# Patient Record
Sex: Male | Born: 1955 | Race: White | Hispanic: No | Marital: Married | Smoking: Former smoker
Health system: Southern US, Community
[De-identification: ages and names within clinical notes are randomized; demographics above are authoritative.]

## PROBLEM LIST (undated history)

## (undated) DIAGNOSIS — N200 Calculus of kidney: Secondary | ICD-10-CM

## (undated) HISTORY — DX: Calculus of kidney: N20.0

## (undated) HISTORY — PX: BICEPS TENDON REPAIR: SHX566

---

## 2003-05-29 HISTORY — PX: HERNIA REPAIR: SHX51

## 2005-10-19 ENCOUNTER — Ambulatory Visit: Payer: Self-pay | Admitting: General Surgery

## 2005-10-21 LAB — HM COLONOSCOPY

## 2008-02-27 DIAGNOSIS — E782 Mixed hyperlipidemia: Secondary | ICD-10-CM | POA: Insufficient documentation

## 2008-06-04 DIAGNOSIS — E669 Obesity, unspecified: Secondary | ICD-10-CM | POA: Insufficient documentation

## 2008-06-30 DIAGNOSIS — Z87891 Personal history of nicotine dependence: Secondary | ICD-10-CM | POA: Insufficient documentation

## 2011-04-27 LAB — HEPATIC FUNCTION PANEL
ALT: 23 U/L (ref 10–40)
AST: 22 U/L (ref 14–40)

## 2011-04-27 LAB — LIPID PANEL
Cholesterol: 185 mg/dL (ref 0–200)
HDL: 42 mg/dL (ref 35–70)
LDL Cholesterol: 87 mg/dL
TRIGLYCERIDES: 279 mg/dL — AB (ref 40–160)

## 2011-04-27 LAB — BASIC METABOLIC PANEL
BUN: 14 mg/dL (ref 4–21)
CREATININE: 1.4 mg/dL — AB (ref 0.6–1.3)
Glucose: 92 mg/dL
POTASSIUM: 4.3 mmol/L (ref 3.4–5.3)
Sodium: 139 mmol/L (ref 137–147)

## 2013-09-04 LAB — PSA: PSA: 1

## 2014-10-29 ENCOUNTER — Other Ambulatory Visit: Payer: Self-pay | Admitting: Family Medicine

## 2014-11-24 ENCOUNTER — Encounter (INDEPENDENT_AMBULATORY_CARE_PROVIDER_SITE_OTHER): Payer: Self-pay | Admitting: Family Medicine

## 2015-02-03 LAB — LIPID PANEL
CHOLESTEROL: 203 mg/dL — AB (ref 0–200)
HDL: 45 mg/dL (ref 35–70)
LDL CALC: 103 mg/dL
TRIGLYCERIDES: 272 mg/dL — AB (ref 40–160)

## 2015-02-03 LAB — BASIC METABOLIC PANEL
BUN: 13 mg/dL (ref 4–21)
CREATININE: 1.3 mg/dL (ref 0.6–1.3)
Glucose: 95 mg/dL

## 2015-02-03 LAB — HEPATIC FUNCTION PANEL
ALT: 20 U/L (ref 10–40)
AST: 23 U/L (ref 14–40)

## 2015-04-01 ENCOUNTER — Other Ambulatory Visit: Payer: Self-pay | Admitting: Family Medicine

## 2015-04-13 ENCOUNTER — Telehealth: Payer: Self-pay | Admitting: Family Medicine

## 2015-04-13 NOTE — Telephone Encounter (Signed)
Pt wife Manus Gunning called to get a refill for Rx pravastatin (PRAVACHOL) 40 MG tablet but there is a note stating pt needs an office visit.  Pt wife is requesting pt to see Dr Venia Minks instead of Dr Caryn Section from now on.  CB#[phone] or [phone]

## 2015-04-13 NOTE — Telephone Encounter (Signed)
Disregard this note.   Pt wife called back stating pt does not want to switch from Dr Opal Sidles

## 2015-05-18 ENCOUNTER — Other Ambulatory Visit: Payer: Self-pay | Admitting: Family Medicine

## 2015-05-18 NOTE — Telephone Encounter (Signed)
Please advise patient it has been over a year and a half since he has been seen and needs to scheduled o.v. Before we can refill his medications.

## 2015-05-20 NOTE — Telephone Encounter (Signed)
LMTCB 05/20/2015  Thanks,   -Mickel Baas

## 2015-05-31 NOTE — Telephone Encounter (Signed)
Pt returned call. I advised that pt would need an OV. I offered to schedule the appt and pt stated it was ridiculous to just come in and he would have to call back after looking at his schedule. Thanks TNP

## 2015-06-10 DIAGNOSIS — Z87442 Personal history of urinary calculi: Secondary | ICD-10-CM | POA: Insufficient documentation

## 2015-06-10 DIAGNOSIS — Z8249 Family history of ischemic heart disease and other diseases of the circulatory system: Secondary | ICD-10-CM | POA: Insufficient documentation

## 2015-06-14 ENCOUNTER — Encounter: Payer: Self-pay | Admitting: Family Medicine

## 2015-06-14 ENCOUNTER — Ambulatory Visit (INDEPENDENT_AMBULATORY_CARE_PROVIDER_SITE_OTHER): Payer: BLUE CROSS/BLUE SHIELD | Admitting: Family Medicine

## 2015-06-14 VITALS — BP 122/70 | HR 60 | Temp 98.0°F | Resp 16 | Ht 70.0 in | Wt 254.0 lb

## 2015-06-14 DIAGNOSIS — Z23 Encounter for immunization: Secondary | ICD-10-CM

## 2015-06-14 DIAGNOSIS — N529 Male erectile dysfunction, unspecified: Secondary | ICD-10-CM | POA: Diagnosis not present

## 2015-06-14 DIAGNOSIS — Z0000 Encounter for general adult medical examination without abnormal findings: Secondary | ICD-10-CM | POA: Diagnosis not present

## 2015-06-14 DIAGNOSIS — E669 Obesity, unspecified: Secondary | ICD-10-CM | POA: Diagnosis not present

## 2015-06-14 DIAGNOSIS — E782 Mixed hyperlipidemia: Secondary | ICD-10-CM

## 2015-06-14 DIAGNOSIS — Z8249 Family history of ischemic heart disease and other diseases of the circulatory system: Secondary | ICD-10-CM

## 2015-06-14 MED ORDER — PRAVASTATIN SODIUM 40 MG PO TABS: 40.0000 mg | ORAL_TABLET | Freq: Every day | ORAL | Status: DC

## 2015-06-14 MED ORDER — PREDNISONE 20 MG PO TABS: ORAL_TABLET | ORAL | Status: DC

## 2015-06-14 MED ORDER — NAPROXEN 500 MG PO TABS: 500.0000 mg | ORAL_TABLET | Freq: Two times a day (BID) | ORAL | Status: DC

## 2015-06-14 NOTE — Progress Notes (Signed)
Patient: Bruce White, Male    DOB: 04-28-1956, 60 y.o.   MRN: [id number] Visit Date: 06/21/2015  Today's Provider: Lelon Huh, MD   Chief Complaint  Patient presents with  . Annual Exam  . Hyperlipidemia    follow up   Subjective:    Annual physical exam Bruce White is a 60 y.o. male who presents today for health maintenance and complete physical. He feels well. He reports exercising a couple times a week. He reports he is sleeping well.  -----------------------------------------------------------------  Lipid/Cholesterol, Follow-up:   Last seen for this a year and a half ago. Management changes since that visit include none. . Last Lipid Panel:    Component Value Date/Time   CHOL 203* 02/03/2015   TRIG 272* 02/03/2015   HDL 45 02/03/2015   LDLCALC 103 02/03/2015    Risk factors for vascular disease include hypercholesterolemia  He reports good compliance with treatment. Patient reports he ran out of medication over a week ago.  He is not having side effects.  Current symptoms include none and have been stable. Weight trend: increasing  Prior visit with dietician: no Current diet: in general, a "healthy" diet   Current exercise: cardiovascular workout on exercise equipment  Wt Readings from Last 3 Encounters:  06/14/15 254 lb (115.214 kg)  09/04/13 228 lb (103.42 kg)  11/24/14 243 lb (110.224 kg)    -------------------------------------------------------------------   Review of Systems  Constitutional: Negative for fever, chills, appetite change and fatigue.  HENT: Negative for congestion, ear pain, hearing loss, nosebleeds and trouble swallowing.   Eyes: Negative for pain and visual disturbance.  Respiratory: Negative for cough, chest tightness and shortness of breath.   Cardiovascular: Negative for chest pain, palpitations and leg swelling.  Gastrointestinal: Negative for nausea, vomiting, abdominal pain, diarrhea, constipation and  blood in stool.  Endocrine: Negative for polydipsia, polyphagia and polyuria.  Genitourinary: Negative for dysuria and flank pain.  Musculoskeletal: Negative for myalgias, back pain, joint swelling, arthralgias and neck stiffness.  Skin: Negative for color change, rash and wound.  Neurological: Negative for dizziness, tremors, seizures, speech difficulty, weakness, light-headedness and headaches.  Psychiatric/Behavioral: Negative for behavioral problems, confusion, sleep disturbance, dysphoric mood and decreased concentration. The patient is not nervous/anxious.   All other systems reviewed and are negative.   Social History      He  reports that he quit smoking about 12 years ago. His smoking use included Cigarettes. He has a 60 pack-year smoking history. He does not have any smokeless tobacco history on file. He reports that he drinks alcohol. He reports that he does not use illicit drugs.       Social History   Social History  . Marital Status: Married    Spouse Name: N/A  . Number of Children: 2  . Years of Education: N/A   Occupational History  . [employer]    Social History Main Topics  . Smoking status: Former Smoker -- 2.00 packs/day for 30 years    Types: Cigarettes    Quit date: 05/29/2003  . Smokeless tobacco: None  . Alcohol Use: 0.0 oz/week    0 Standard drinks or equivalent per week     Comment: occasional use  . Drug Use: No  . Sexual Activity: Not Asked   Other Topics Concern  . None   Social History Narrative    Past Medical History  Diagnosis Date  . Kidney stones      Patient Active  Problem List   Diagnosis Date Noted  . Fam hx-ischem heart disease 06/10/2015  . Personal history of urinary calculi 06/10/2015  . History of tobacco use 06/30/2008  . Obesity 06/04/2008  . Hyperlipidemia, mixed 02/27/2008    Past Surgical History  Procedure Laterality Date  . Hernia repair  [id number]    Umbilical hernia repair. Surgeon was Dr. Jamal Collin  . Biceps  tendon repair      Family History        Family Status  Relation Status Death Age  . Mother Alive   . Father Alive   . Brother Alive   . Other Alive         His family history includes CAD in his father; Dementia in his father; Heart attack in his brother; Heart disease in his other; Hypertension in his father; Parkinson's disease in his father; Stroke in his father.    Allergies  Allergen Reactions  . Lovastatin     Previous Medications   ASPIRIN EC 81 MG TABLET    Take 81 mg by mouth daily.    Patient Care Team: Birdie Sons, MD as PCP - General (Family Medicine)     Objective:   Vitals: BP 122/70 mmHg  Pulse 60  Temp(Src) 98 F (36.7 C) (Oral)  Resp 16  Ht 5\' 10"  (1.778 m)  Wt 254 lb (115.214 kg)  BMI 36.45 kg/m2  SpO2 95%   Physical Exam   General Appearance:    Alert, cooperative, no distress, appears stated age  Head:    Normocephalic, without obvious abnormality, atraumatic  Eyes:    PERRL, conjunctiva/corneas clear, EOM's intact, fundi    benign, both eyes       Ears:    Normal TM's and external ear canals, both ears  Nose:   Nares normal, septum midline, mucosa normal, no drainage   or sinus tenderness  Throat:   Lips, mucosa, and tongue normal; teeth and gums normal  Neck:   Supple, symmetrical, trachea midline, no adenopathy;       thyroid:  No enlargement/tenderness/nodules; no carotid   bruit or JVD  Back:     Symmetric, no curvature, ROM normal, no CVA tenderness  Lungs:     Clear to auscultation bilaterally, respirations unlabored  Chest wall:    No tenderness or deformity  Heart:    Regular rate and rhythm, S1 and S2 normal, no murmur, rub   or gallop  Abdomen:     Soft, non-tender, bowel sounds active all four quadrants,    no masses, no organomegaly  Genitalia:    deferred  Rectal:    deferred  Extremities:   Extremities normal, atraumatic, no cyanosis or edema  Pulses:   2+ and symmetric all extremities  Skin:   Skin color,  texture, turgor normal, no rashes or lesions  Lymph nodes:   Cervical, supraclavicular, and axillary nodes normal  Neurologic:   CNII-XII intact. Normal strength, sensation and reflexes      throughout   Depression Screen PHQ 2/9 Scores 06/14/2015  PHQ - 2 Score 0  PHQ- 9 Score 0      Assessment & Plan:     Routine Health Maintenance and Physical Exam  Exercise Activities and Dietary recommendations Goals    None      Immunization History  Administered Date(s) Administered  . Influenza-Unspecified 03/12/2015  . Tdap 05/01/2005  . Zoster 06/14/2015    Health Maintenance  Topic Date Due  . HIV Screening  01/25/1970  . TETANUS/TDAP  05/02/2015  . COLONOSCOPY  10/22/2015  . INFLUENZA VACCINE  12/27/2015  . ZOSTAVAX  Completed  . Hepatitis C Screening  Completed      Discussed health benefits of physical activity, and encouraged him to engage in regular exercise appropriate for his age and condition.    --------------------------------------------------------------------  1. Annual physical exam  - PSA - Hepatitis C antibody - EKG 12-Lead  2. Hyperlipidemia, mixed Intolerant to lovastatin. Controlling with diet. Reviewed labs from work which show lipids fairly well controlled.   3. Obesity D&E  4. Fam hx-ischem heart disease Continue daily ECASA  5. Erectile dysfunction, unspecified erectile dysfunction type He declined prescription for ED medications at this time.  - TSH - Testosterone,Free and Total

## 2015-06-15 LAB — TESTOSTERONE,FREE AND TOTAL
TESTOSTERONE FREE: 7.7 pg/mL (ref 6.6–18.1)
Testosterone: 412 ng/dL (ref 348–1197)

## 2015-06-15 LAB — PSA: PROSTATE SPECIFIC AG, SERUM: 0.9 ng/mL (ref 0.0–4.0)

## 2015-06-15 LAB — TSH: TSH: 2.53 u[IU]/mL (ref 0.450–4.500)

## 2015-06-15 LAB — HEPATITIS C ANTIBODY: HEP C VIRUS AB: 0.1 {s_co_ratio} (ref 0.0–0.9)

## 2015-06-16 ENCOUNTER — Encounter: Payer: Self-pay | Admitting: Family Medicine

## 2015-08-08 ENCOUNTER — Ambulatory Visit (INDEPENDENT_AMBULATORY_CARE_PROVIDER_SITE_OTHER): Payer: BLUE CROSS/BLUE SHIELD | Admitting: Family Medicine

## 2015-08-08 ENCOUNTER — Encounter: Payer: Self-pay | Admitting: Family Medicine

## 2015-08-08 VITALS — BP 128/78 | HR 56 | Temp 98.6°F | Resp 14 | Wt 256.8 lb

## 2015-08-08 DIAGNOSIS — M545 Low back pain, unspecified: Secondary | ICD-10-CM

## 2015-08-08 NOTE — Progress Notes (Signed)
Patient ID: KAL WASSMUTH, male   DOB: 1955/12/02, 60 y.o.   MRN: [id number]   Patient: Bruce White Male    DOB: 1955-08-04   60 y.o.   MRN: [id number] Visit Date: 08/08/2015  Today's Provider: Vernie Murders, PA   Chief Complaint  Patient presents with  . Back Pain   Subjective:    Back Pain This is a new problem. The current episode started today. The problem occurs intermittently. The problem is unchanged. The pain is present in the lumbar spine. Quality: sharp. The pain is at a severity of 5/10. The symptoms are aggravated by sitting. Pertinent negatives include no leg pain, numbness, tingling or weakness. The treatment provided mild (2 Aleve and 2 packets of BioFreeze.) relief.    Patient Active Problem List   Diagnosis Date Noted  . Fam hx-ischem heart disease 06/10/2015  . Personal history of urinary calculi 06/10/2015  . History of tobacco use 06/30/2008  . Obesity 06/04/2008  . Hyperlipidemia, mixed 02/27/2008   Past Medical History  Diagnosis Date  . Kidney stones    Past Surgical History  Procedure Laterality Date  . Hernia repair  [id number]    Umbilical hernia repair. Surgeon was Dr. Jamal Collin  . Biceps tendon repair     Family History  Problem Relation Age of Onset  . Hypertension Father   . CAD Father   . Dementia Father   . Stroke Father   . Parkinson's disease Father   . Heart attack Brother   . Heart disease Other    Previous Medications   ASPIRIN EC 81 MG TABLET    Take 81 mg by mouth daily.   NAPROXEN (NAPROSYN) 500 MG TABLET    Take 1 tablet (500 mg total) by mouth 2 (two) times daily with a meal.   PRAVASTATIN (PRAVACHOL) 40 MG TABLET    Take 1 tablet (40 mg total) by mouth daily.   Allergies  Allergen Reactions  . Lovastatin     Review of Systems  Constitutional: Negative.   HENT: Negative.   Eyes: Negative.   Respiratory: Negative.   Cardiovascular: Negative.   Gastrointestinal: Negative.   Endocrine: Negative.   Genitourinary:  Negative.   Musculoskeletal: Positive for back pain.  Skin: Negative.   Allergic/Immunologic: Negative.   Neurological: Negative.  Negative for tingling, weakness and numbness.  Hematological: Negative.   Psychiatric/Behavioral: Negative.     Social History  Substance Use Topics  . Smoking status: Former Smoker -- 2.00 packs/day for 30 years    Types: Cigarettes    Quit date: 05/29/2003  . Smokeless tobacco: Not on file  . Alcohol Use: 0.0 oz/week    0 Standard drinks or equivalent per week     Comment: occasional use   Objective:   BP 128/78 mmHg  Pulse 56  Temp(Src) 98.6 F (37 C) (Oral)  Resp 14  Wt 256 lb 12.8 oz (116.484 kg)  Physical Exam  Constitutional: He is oriented to person, place, and time. He appears well-developed and well-nourished. No distress.  HENT:  Head: Normocephalic and atraumatic.  Right Ear: Hearing normal.  Left Ear: Hearing normal.  Nose: Nose normal.  Eyes: Conjunctivae and lids are normal. Right eye exhibits no discharge. Left eye exhibits no discharge. No scleral icterus.  Neck: Normal range of motion. Neck supple.  Cardiovascular: Normal rate.   Pulmonary/Chest: Effort normal and breath sounds normal. No respiratory distress.  Abdominal: Bowel sounds are normal.  Musculoskeletal: Normal range of motion.  Mild  soreness in lower lumbar musculature. Good ROM without spasms. No radiation of pain or weakness. DTR's symmetric. Normal gait.  Neurological: He is alert and oriented to person, place, and time. He has normal reflexes.  Skin: Skin is intact. No lesion and no rash noted.  Psychiatric: He has a normal mood and affect. His speech is normal and behavior is normal. Thought content normal.      Assessment & Plan:     1. Midline low back pain without sciatica Onset this morning (around 6AM) when he bent over the bed to pick up a sweatshirt. No significant history of back pains or injuries. Rides in a car for long distances frequently and  some heavy lifting occasionally at work. Continue Aleve BID for a week, apply moist heat or BioFreeze rub. May add Percogesic for muscle tightness. Recheck prn. Given rehab exercises and information sheets.

## 2015-08-08 NOTE — Patient Instructions (Signed)
Back Exercises The following exercises strengthen the muscles that help to support the back. They also help to keep the lower back flexible. Doing these exercises can help to prevent back pain or lessen existing pain. If you have back pain or discomfort, try doing these exercises 2-3 times each day or as told by your health care provider. When the pain goes away, do them once each day, but increase the number of times that you repeat the steps for each exercise (do more repetitions). If you do not have back pain or discomfort, do these exercises once each day or as told by your health care provider. EXERCISES Single Knee to Chest Repeat these steps 3-5 times for each leg: 1. Lie on your back on a firm bed or the floor with your legs extended. 2. Bring one knee to your chest. Your other leg should stay extended and in contact with the floor. 3. Hold your knee in place by grabbing your knee or thigh. 4. Pull on your knee until you feel a gentle stretch in your lower back. 5. Hold the stretch for 10-30 seconds. 6. Slowly release and straighten your leg. Pelvic Tilt Repeat these steps 5-10 times: 1. Lie on your back on a firm bed or the floor with your legs extended. 2. Bend your knees so they are pointing toward the ceiling and your feet are flat on the floor. 3. Tighten your lower abdominal muscles to press your lower back against the floor. This motion will tilt your pelvis so your tailbone points up toward the ceiling instead of pointing to your feet or the floor. 4. With gentle tension and even breathing, hold this position for 5-10 seconds. Cat-Cow Repeat these steps until your lower back becomes more flexible: 1. Get into a hands-and-knees position on a firm surface. Keep your hands under your shoulders, and keep your knees under your hips. You may place padding under your knees for comfort. 2. Let your head hang down, and point your tailbone toward the floor so your lower back becomes  rounded like the back of a cat. 3. Hold this position for 5 seconds. 4. Slowly lift your head and point your tailbone up toward the ceiling so your back forms a sagging arch like the back of a cow. 5. Hold this position for 5 seconds. Press-Ups Repeat these steps 5-10 times: 1. Lie on your abdomen (face-down) on the floor. 2. Place your palms near your head, about shoulder-width apart. 3. While you keep your back as relaxed as possible and keep your hips on the floor, slowly straighten your arms to raise the top half of your body and lift your shoulders. Do not use your back muscles to raise your upper torso. You may adjust the placement of your hands to make yourself more comfortable. 4. Hold this position for 5 seconds while you keep your back relaxed. 5. Slowly return to lying flat on the floor. Bridges Repeat these steps 10 times: 1. Lie on your back on a firm surface. 2. Bend your knees so they are pointing toward the ceiling and your feet are flat on the floor. 3. Tighten your buttocks muscles and lift your buttocks off of the floor until your waist is at almost the same height as your knees. You should feel the muscles working in your buttocks and the back of your thighs. If you do not feel these muscles, slide your feet 1-2 inches farther away from your buttocks. 4. Hold this position for 3-5   seconds. 5. Slowly lower your hips to the starting position, and allow your buttocks muscles to relax completely. If this exercise is too easy, try doing it with your arms crossed over your chest. Abdominal Crunches Repeat these steps 5-10 times: 1. Lie on your back on a firm bed or the floor with your legs extended. 2. Bend your knees so they are pointing toward the ceiling and your feet are flat on the floor. 3. Cross your arms over your chest. 4. Tip your chin slightly toward your chest without bending your neck. 5. Tighten your abdominal muscles and slowly raise your trunk (torso) high  enough to lift your shoulder blades a tiny bit off of the floor. Avoid raising your torso higher than that, because it can put too much stress on your low back and it does not help to strengthen your abdominal muscles. 6. Slowly return to your starting position. Back Lifts Repeat these steps 5-10 times: 1. Lie on your abdomen (face-down) with your arms at your sides, and rest your forehead on the floor. 2. Tighten the muscles in your legs and your buttocks. 3. Slowly lift your chest off of the floor while you keep your hips pressed to the floor. Keep the back of your head in line with the curve in your back. Your eyes should be looking at the floor. 4. Hold this position for 3-5 seconds. 5. Slowly return to your starting position. SEEK MEDICAL CARE IF:  Your back pain or discomfort gets much worse when you do an exercise.  Your back pain or discomfort does not lessen within 2 hours after you exercise. If you have any of these problems, stop doing these exercises right away. Do not do them again unless your health care provider says that you can. SEEK IMMEDIATE MEDICAL CARE IF:  You develop sudden, severe back pain. If this happens, stop doing the exercises right away. Do not do them again unless your health care provider says that you can.   This information is not intended to replace advice given to you by your health care provider. Make sure you discuss any questions you have with your health care provider.   Document Released: 06/21/2004 Document Revised: 02/02/2015 Document Reviewed: 07/08/2014 Elsevier Interactive Patient Education 2016 [employer] Back Pain, Adult Back pain is very common in adults.The cause of back pain is rarely dangerous and the pain often gets better over time.The cause of your back pain may not be known. Some common causes of back pain include: 7. Strain of the muscles or ligaments supporting the spine. 8. Wear and tear (degeneration) of the spinal  disks. 9. Arthritis. 10. Direct injury to the back. For many people, back pain may return. Since back pain is rarely dangerous, most people can learn to manage this condition on their own. HOME CARE INSTRUCTIONS Watch your back pain for any changes. The following actions may help to lessen any discomfort you are feeling: 5. Remain active. It is stressful on your back to sit or stand in one place for long periods of time. Do not sit, drive, or stand in one place for more than 30 minutes at a time. Take short walks on even surfaces as soon as you are able.Try to increase the length of time you walk each day. 6. Exercise regularly as directed by your health care provider. Exercise helps your back heal faster. It also helps avoid future injury by keeping your muscles strong and flexible. 7. Do not stay in bed.Resting  more than 1-2 days can delay your recovery. 8. Pay attention to your body when you bend and lift. The most comfortable positions are those that put less stress on your recovering back. Always use proper lifting techniques, including: 1. Bending your knees. 2. Keeping the load close to your body. 3. Avoiding twisting. 9. Find a comfortable position to sleep. Use a firm mattress and lie on your side with your knees slightly bent. If you lie on your back, put a pillow under your knees. 10. Avoid feeling anxious or stressed.Stress increases muscle tension and can worsen back pain.It is important to recognize when you are anxious or stressed and learn ways to manage it, such as with exercise. 11. Take medicines only as directed by your health care provider. Over-the-counter medicines to reduce pain and inflammation are often the most helpful.Your health care provider may prescribe muscle relaxant drugs.These medicines help dull your pain so you can more quickly return to your normal activities and healthy exercise. 12. Apply ice to the injured area: 1. Put ice in a plastic bag. 2. Place a  towel between your skin and the bag. 3. Leave the ice on for 20 minutes, 2-3 times a day for the first 2-3 days. After that, ice and heat may be alternated to reduce pain and spasms. 13. Maintain a healthy weight. Excess weight puts extra stress on your back and makes it difficult to maintain good posture. SEEK MEDICAL CARE IF: 6. You have pain that is not relieved with rest or medicine. 7. You have increasing pain going down into the legs or buttocks. 8. You have pain that does not improve in one week. 9. You have night pain. 10. You lose weight. 11. You have a fever or chills. SEEK IMMEDIATE MEDICAL CARE IF:  6. You develop new bowel or bladder control problems. 7. You have unusual weakness or numbness in your arms or legs. 8. You develop nausea or vomiting. 9. You develop abdominal pain. 10. You feel faint.   This information is not intended to replace advice given to you by your health care provider. Make sure you discuss any questions you have with your health care provider.   Document Released: 05/14/2005 Document Revised: 06/04/2014 Document Reviewed: 09/15/2013 Elsevier Interactive Patient Education [employer].

## 2015-11-07 ENCOUNTER — Telehealth: Payer: Self-pay | Admitting: Family Medicine

## 2015-11-07 DIAGNOSIS — M25569 Pain in unspecified knee: Secondary | ICD-10-CM | POA: Insufficient documentation

## 2015-11-07 NOTE — Telephone Encounter (Signed)
Pt's wife called and stated the patient would like a referral to an orthopedic doctor for knee problems.  I told the pt's wife he would probably need an office visit due to the referral and typically we have to send over notes to the referring provider and wife stated the pt didn't want to come see Dr. Caryn Section for a knee problem because he is not a knee doctor.  I told her I would send a message back.  Please advise.

## 2015-11-07 NOTE — Telephone Encounter (Signed)
Please refer orthopedics for knee pain

## 2015-11-07 NOTE — Telephone Encounter (Signed)
Please advise 

## 2015-11-11 DIAGNOSIS — M2241 Chondromalacia patellae, right knee: Secondary | ICD-10-CM | POA: Diagnosis not present

## 2015-11-11 DIAGNOSIS — M2242 Chondromalacia patellae, left knee: Secondary | ICD-10-CM | POA: Diagnosis not present

## 2015-11-11 DIAGNOSIS — M2240 Chondromalacia patellae, unspecified knee: Secondary | ICD-10-CM | POA: Insufficient documentation

## 2016-01-03 NOTE — Telephone Encounter (Signed)
error 

## 2016-03-11 ENCOUNTER — Encounter: Payer: Self-pay | Admitting: Emergency Medicine

## 2016-03-11 ENCOUNTER — Emergency Department: Payer: BLUE CROSS/BLUE SHIELD

## 2016-03-11 ENCOUNTER — Emergency Department
Admission: EM | Admit: 2016-03-11 | Discharge: 2016-03-11 | Disposition: A | Discharge status: Home / Self Care | Payer: BLUE CROSS/BLUE SHIELD | Attending: Emergency Medicine | Admitting: Emergency Medicine

## 2016-03-11 DIAGNOSIS — M7122 Synovial cyst of popliteal space [Baker], left knee: Secondary | ICD-10-CM | POA: Diagnosis not present

## 2016-03-11 DIAGNOSIS — M25462 Effusion, left knee: Secondary | ICD-10-CM | POA: Diagnosis not present

## 2016-03-11 DIAGNOSIS — Z87891 Personal history of nicotine dependence: Secondary | ICD-10-CM | POA: Diagnosis not present

## 2016-03-11 DIAGNOSIS — M7989 Other specified soft tissue disorders: Secondary | ICD-10-CM

## 2016-03-11 DIAGNOSIS — M79662 Pain in left lower leg: Secondary | ICD-10-CM

## 2016-03-11 DIAGNOSIS — Z7982 Long term (current) use of aspirin: Secondary | ICD-10-CM | POA: Diagnosis not present

## 2016-03-11 MED ORDER — OXYCODONE-ACETAMINOPHEN 5-325 MG PO TABS: 1.0000 | ORAL_TABLET | ORAL | 0 refills | Status: DC | PRN

## 2016-03-11 MED ORDER — NAPROXEN 500 MG PO TABS: 500.0000 mg | ORAL_TABLET | Freq: Two times a day (BID) | ORAL | 0 refills | Status: AC

## 2016-03-11 NOTE — ED Triage Notes (Signed)
Pt has pain and swelling to left calf. Does travel to the beach and back some. Swelling also noted to left knee. Calf tight on exam. No redness. No fevers.

## 2016-03-11 NOTE — ED Provider Notes (Signed)
[facility] Emergency Department Provider Note  ____________________________________________  Time seen: Approximately 5:12 PM  I have reviewed the triage vital signs and the nursing notes.   HISTORY  Chief Complaint Other (calf pain)   HPI Bruce White is a 60 y.o. male with h/o b/l knee arthritis who presents for evaluation of swelling and pain of his left calf. Patient reports 3 days ago he noticed a knot behind his left knee. Over the course of the last 3 days he has noticed progressively worsening swelling and pain on that leg. The pain is only present when he walks or bears weight on it. He has had no pain at rest. No chest pain or shortness of breath. Patient has never had a DVT or PE, no family history of blood clots, no recent travel or immobilization, no hormones. 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 Currently he endorses no pain while sitting down in bed. He reports that the pain becomes moderate to severe when he weigh bears.   Past Medical History:  Diagnosis Date  . Kidney stones     Patient Active Problem List   Diagnosis Date Noted  . Knee pain 11/07/2015  . Fam hx-ischem heart disease 06/10/2015  . Personal history of urinary calculi 06/10/2015  . History of tobacco use 06/30/2008  . Obesity 06/04/2008  . Hyperlipidemia, mixed 02/27/2008    Past Surgical History:  Procedure Laterality Date  . BICEPS TENDON REPAIR    . HERNIA REPAIR  [id number]   Umbilical hernia repair. Surgeon was Dr. Jamal Collin    Prior to Admission medications   Medication Sig Start Date End Date Taking? Authorizing Provider  aspirin EC 81 MG tablet Take 81 mg by mouth daily.    Historical Provider, MD  naproxen (NAPROSYN) 500 MG tablet Take 1 tablet (500 mg total) by mouth 2 (two) times daily with a meal. 03/11/16 03/11/17  Rudene Re, MD  oxyCODONE-acetaminophen (ROXICET)  5-325 MG tablet Take 1 tablet by mouth every 4 (four) hours as needed for severe pain. 03/11/16   Rudene Re, MD  pravastatin (PRAVACHOL) 40 MG tablet Take 1 tablet (40 mg total) by mouth daily. 06/14/15   Birdie Sons, MD    Allergies Lovastatin  Family History  Problem Relation Age of Onset  . Hypertension Father   . CAD Father   . Dementia Father   . Stroke Father   . Parkinson's disease Father   . Heart attack Brother   . Heart disease Other     Social History Social History  Substance Use Topics  . Smoking status: Former Smoker    Packs/day: 2.00    Years: 30.00    Types: Cigarettes    Quit date: 05/29/2003  . Smokeless tobacco: Not on file  . Alcohol use 0.0 oz/week     Comment: occasional use    Review of Systems  Constitutional: Negative for fever. Eyes: Negative for visual changes. ENT: Negative for sore throat. Cardiovascular: Negative for chest pain. Respiratory: Negative for shortness of breath. Gastrointestinal: Negative for abdominal pain, vomiting or diarrhea. Genitourinary: Negative for dysuria. Musculoskeletal: Negative for back pain. + LLE pain and swelling Skin: Negative for rash. Neurological: Negative for headaches, weakness or numbness.  ____________________________________________   PHYSICAL EXAM:  VITAL SIGNS: ED Triage Vitals [03/11/16 1522]  Enc Vitals Group     BP (!) 141/105  Pulse Rate 92     Resp 18     Temp 97.7 F (36.5 C)     Temp Source Oral     SpO2 98 %     Weight 235 lb (106.6 kg)     Height 5\' 10"  (1.778 m)     Head Circumference      Peak Flow      Pain Score 5     Pain Loc      Pain Edu?      Excl. in [place]?     Constitutional: Alert and oriented. Well appearing and in no apparent distress. HEENT:      Head: Normocephalic and atraumatic.         Eyes: Conjunctivae are normal. Sclera is non-icteric. EOMI. PERRL      Mouth/Throat: Mucous membranes are moist.       Neck: Supple with no signs of  meningismus. Cardiovascular: Regular rate and rhythm. No murmurs, gallops, or rubs. 2+ symmetrical distal pulses are present in all extremities. No JVD. Respiratory: Normal respiratory effort. Lungs are clear to auscultation bilaterally. No wheezes, crackles, or rhonchi.  Gastrointestinal: Soft, non tender, and non distended with positive bowel sounds. No rebound or guarding. Musculoskeletal: Asymmetric swelling of the left calf, no pain with passive flexion and extension of the big toe, strong distal pulses, no bruising, no bony deformities.  Neurologic: Normal speech and language. Face is symmetric. Moving all extremities. No gross focal neurologic deficits are appreciated. Skin: Skin is warm, dry and intact. No rash noted. Psychiatric: Mood and affect are normal. Speech and behavior are normal.  ____________________________________________   LABS (all labs ordered are listed, but only abnormal results are displayed)  Labs Reviewed - No data to display ____________________________________________  EKG  none ____________________________________________  RADIOLOGY  XR knee:  Small joint effusion without focal acute osseous abnormality. This may be related to inflammatory arthritis or soft tissue injury  US doppler: Sonographic survey of the left lower extremity negative for DVT.  Fluid collection left popliteal fossa, likely Baker's cyst. ____________________________________________   PROCEDURES  Procedure(s) performed: None Procedures Critical Care performed:  None ____________________________________________   INITIAL IMPRESSION / ASSESSMENT AND PLAN / ED COURSE  60 y.o. male with h/o b/l knee arthritis who presents for evaluation of swelling and pain of his left calf x3 days. Patient has significant asymmetric swelling of the left lower extremity starting at the knee going down the calf. Patient has strong distal pulses, no tenderness with passive movement of his big  toe, no pain at rest, no paresthesias. Doppler ultrasound showing no evidence of DVT and consistent with a Baker's cyst in the left popliteal fossa. Patient no signs or symptoms of compartment syndrome. Discussed with Dr. Roland Rack recommended NSAIDs, elevation, compression stocking, and follow-up in the clinic for possible steroid injections if symptoms are not getting better. Discussed signs or symptoms of compartment syndrome with patient and recommended that he return if these develop. Patient is in agreement. We'll discharge home.  Clinical Course    Pertinent labs & imaging results that were available during my care of the patient were reviewed by me and considered in my medical decision making (see chart for details).    ____________________________________________   FINAL CLINICAL IMPRESSION(S) / ED DIAGNOSES  Final diagnoses:  Baker cyst, left  Pain and swelling of left lower leg      NEW MEDICATIONS STARTED DURING THIS VISIT:  New Prescriptions   NAPROXEN (NAPROSYN) 500 MG TABLET  Take 1 tablet (500 mg total) by mouth 2 (two) times daily with a meal.   OXYCODONE-ACETAMINOPHEN (ROXICET) 5-325 MG TABLET    Take 1 tablet by mouth every 4 (four) hours as needed for severe pain.     Note:  This document was prepared using Dragon voice recognition software and may include unintentional dictation errors.    Rudene Re, MD 03/11/16 1728

## 2016-03-11 NOTE — Discharge Instructions (Signed)
Follow-up with your primary care doctor in a week. Follow-up with orthopedics if you're still having symptoms after a week. As we discussed there is a very serious condition called compartment syndrome that can develop due to the swelling. If the symptoms below develop please return to the emergency room immediately.  The most common symptom of compartment syndrome is pain. The pain may:  Get worse when moving or stretching the affected body part. Be more severe than it should be for an injury. Come along with a feeling of tingling or burning. Become worse when the area is pushed or squeezed. Be unaffected by pain medicine. Other symptoms include:  A feeling of tightness or fullness in the affected area.   A loss of feeling. Weakness in the area. Loss of movement. Skin becoming pale, tight, and shiny over the painful area.     For the pain take medications as prescribed, elevate the leg, compression stockings and stay off your feet as much as you can

## 2016-03-11 NOTE — ED Notes (Signed)
Pt states knee pain since the first of the year - saw dr Sabra Heck in June and was told to loose weight and placed on meloxicam. States the pain comes when he walks a lot, which he has been doing the last 3 days. Declined to sit in the bed, wanted to sit in the wheelchair. Had his [place] and xr.

## 2016-03-21 ENCOUNTER — Telehealth: Payer: Self-pay | Admitting: Family Medicine

## 2016-03-21 NOTE — Telephone Encounter (Signed)
error 

## 2016-03-22 DIAGNOSIS — M2241 Chondromalacia patellae, right knee: Secondary | ICD-10-CM | POA: Diagnosis not present

## 2016-03-22 DIAGNOSIS — M2242 Chondromalacia patellae, left knee: Secondary | ICD-10-CM | POA: Diagnosis not present

## 2016-07-18 ENCOUNTER — Other Ambulatory Visit: Payer: Self-pay | Admitting: Family Medicine

## 2016-07-27 ENCOUNTER — Other Ambulatory Visit: Payer: Self-pay | Admitting: Family Medicine

## 2016-07-27 MED ORDER — PRAVASTATIN SODIUM 40 MG PO TABS: 40.0000 mg | ORAL_TABLET | Freq: Every day | ORAL | 0 refills | Status: DC

## 2016-07-27 NOTE — Telephone Encounter (Signed)
Pt needs refill on his   pravastatin (PRAVACHOL) 40 MG tablet   07/18/16 -- Birdie Sons, MD    TAKE 1 TABLET (40 MG TOTAL) BY MOUTH DAILY.     Thanks [employer]

## 2016-07-27 NOTE — Telephone Encounter (Signed)
Please advise. Thanks.  

## 2016-10-16 ENCOUNTER — Other Ambulatory Visit: Payer: Self-pay | Admitting: Family Medicine

## 2016-12-21 ENCOUNTER — Other Ambulatory Visit: Payer: Self-pay | Admitting: Family Medicine

## 2017-01-15 DIAGNOSIS — H43313 Vitreous membranes and strands, bilateral: Secondary | ICD-10-CM | POA: Diagnosis not present

## 2017-01-15 DIAGNOSIS — H2513 Age-related nuclear cataract, bilateral: Secondary | ICD-10-CM | POA: Diagnosis not present

## 2017-01-16 ENCOUNTER — Other Ambulatory Visit: Payer: Self-pay | Admitting: Family Medicine

## 2017-02-20 ENCOUNTER — Other Ambulatory Visit: Payer: Self-pay | Admitting: Family Medicine

## 2017-02-20 NOTE — Telephone Encounter (Signed)
He can schedule a CPE. There is no copay required for preventative care visits.

## 2017-02-20 NOTE — Telephone Encounter (Signed)
Patient's wife advised. She states she will advise patient and call back to schedule appointment.

## 2017-02-20 NOTE — Telephone Encounter (Signed)
Patient's wife advised that patient needs a follow up appointment before refills can be authorized. She states they changed insurance companies and he would have to pay $170 co pay to be seen. She is requesting a lab slip to have labs done instead of a OV. Please advise.

## 2017-02-20 NOTE — Telephone Encounter (Signed)
Patient has not been seen in nearly a year and a half, needs o.v. Scheduled before refill can be approved.

## 2017-02-25 ENCOUNTER — Other Ambulatory Visit: Payer: Self-pay | Admitting: Family Medicine

## 2017-03-14 ENCOUNTER — Other Ambulatory Visit: Payer: Self-pay | Admitting: Family Medicine

## 2017-03-18 ENCOUNTER — Other Ambulatory Visit: Payer: Self-pay | Admitting: Family Medicine

## 2017-04-17 ENCOUNTER — Other Ambulatory Visit: Payer: Self-pay | Admitting: Family Medicine

## 2017-04-30 ENCOUNTER — Encounter: Payer: Self-pay | Admitting: *Deleted

## 2017-05-07 ENCOUNTER — Other Ambulatory Visit: Payer: Self-pay | Admitting: Family Medicine

## 2017-05-09 ENCOUNTER — Telehealth: Payer: Self-pay | Admitting: Family Medicine

## 2017-05-09 NOTE — Telephone Encounter (Signed)
no

## 2017-05-09 NOTE — Telephone Encounter (Signed)
Please advise refill? 

## 2017-05-09 NOTE — Telephone Encounter (Signed)
Pt's wife Manus Gunning contacted office for refill request on the following medications:  pravastatin (PRAVACHOL) 40 MG tablet  [facility] [address] [address] advised wife that pt would need OV for refill. Manus Gunning stated that their insurance has a high deductible and it would cost them 170$ out of pocket for an OV. I advised pt hasn't been seen in over a year and he would have to come in for OV. Manus Gunning stated that she would talk with pt but requested for the request to be sent to Dr. Caryn Section to see if he would refill the medication without OV. Please advise. Thanks TNP

## 2017-05-10 MED ORDER — PRAVASTATIN SODIUM 40 MG PO TABS: 40.0000 mg | ORAL_TABLET | Freq: Every day | ORAL | 0 refills | Status: DC

## 2017-05-10 NOTE — Telephone Encounter (Signed)
He needs office visit. This is a requirement by the medical board. Have sent in 30 day prescription. He can schedule a CPE here in January, there is no out of pocket expense for CPE.

## 2017-05-10 NOTE — Telephone Encounter (Signed)
Advised wife as below. She will get the patient to call and schedule a CPE in January.

## 2017-05-10 NOTE — Telephone Encounter (Signed)
Advised patient's wife as below. She reports that the patient had a physical done thru his employer in 01/2017. She reports that everything was "normal" and he also had labs done (this is scanned into patient's chart). She is wanting to know if the labs he had done would be good enough for you to go by so he can have his cholesterol medication refilled? She is not willing to pay the $170 out of pocket. Please advise.

## 2017-05-15 DIAGNOSIS — J189 Pneumonia, unspecified organism: Secondary | ICD-10-CM | POA: Diagnosis not present

## 2017-05-15 DIAGNOSIS — R05 Cough: Secondary | ICD-10-CM | POA: Diagnosis not present

## 2017-05-15 DIAGNOSIS — M791 Myalgia, unspecified site: Secondary | ICD-10-CM | POA: Diagnosis not present

## 2017-05-17 IMAGING — US US EXTREM LOW VENOUS*L*
1 series · 13 of 24 positions shown · non-contrast
Comparison: None.

CLINICAL DATA: 60-year-old male with a history of left knee calf
swell



[Series 1: us extrem low venous*left* · 0.07mm/px · 13 of 48 slices shown]
[im 1/48]
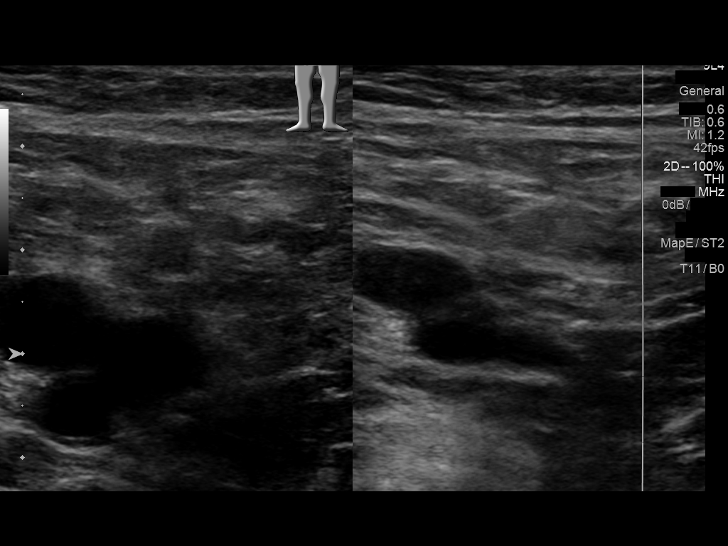
[im 5/48]
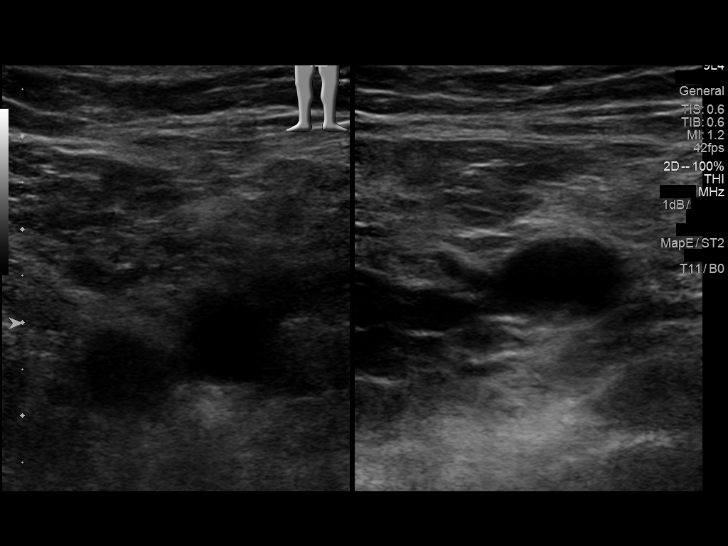
[im 9/48]
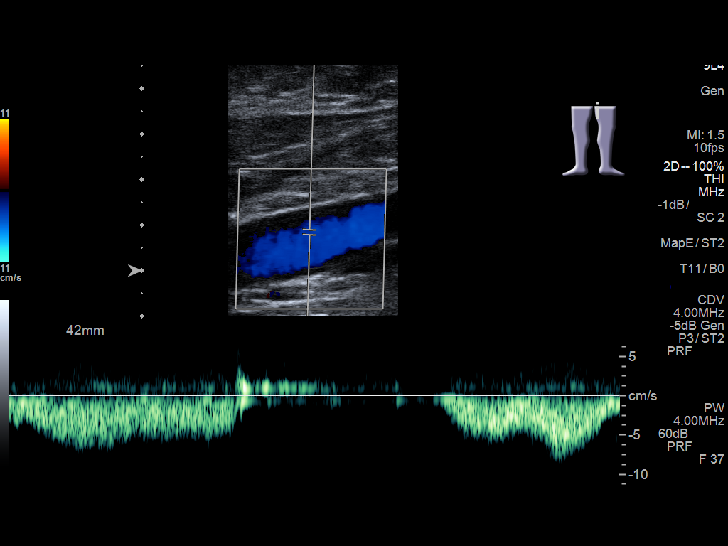
[im 13/48]
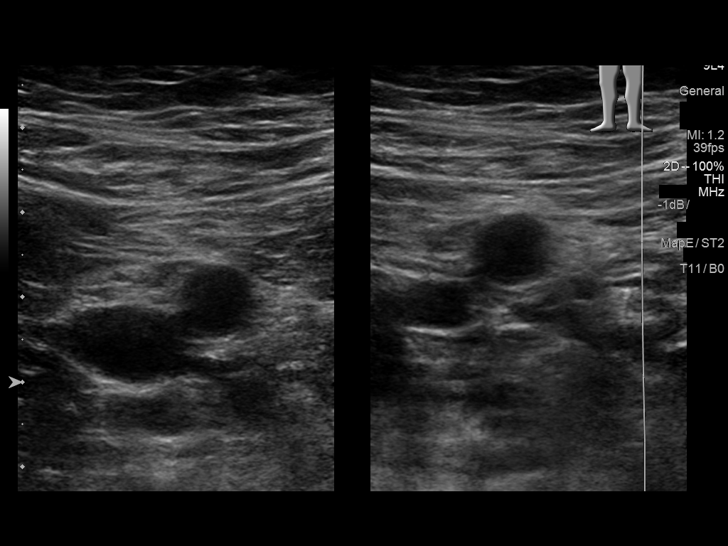
[im 17/48]
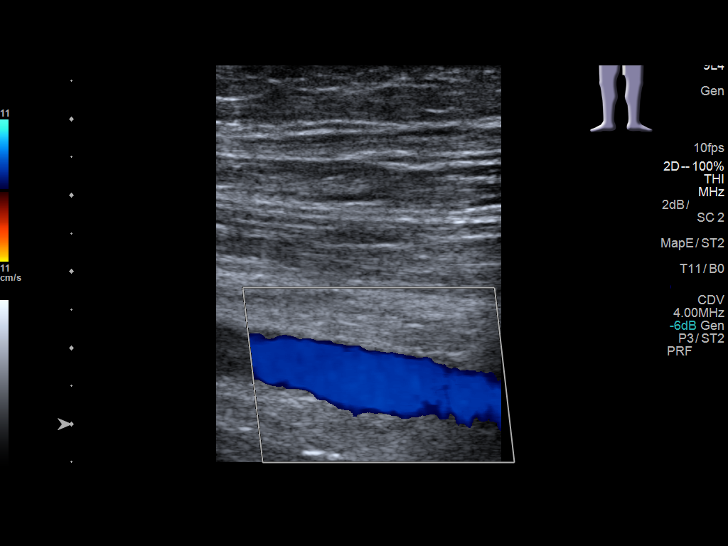
[im 21/48]
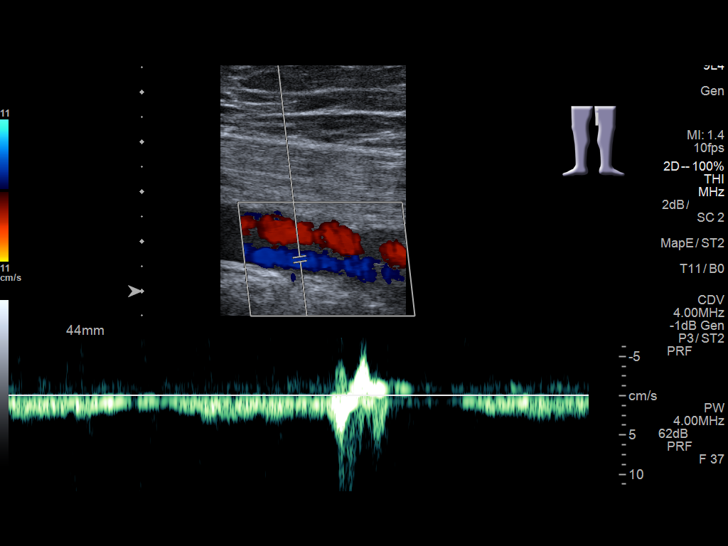
[im 25/48]
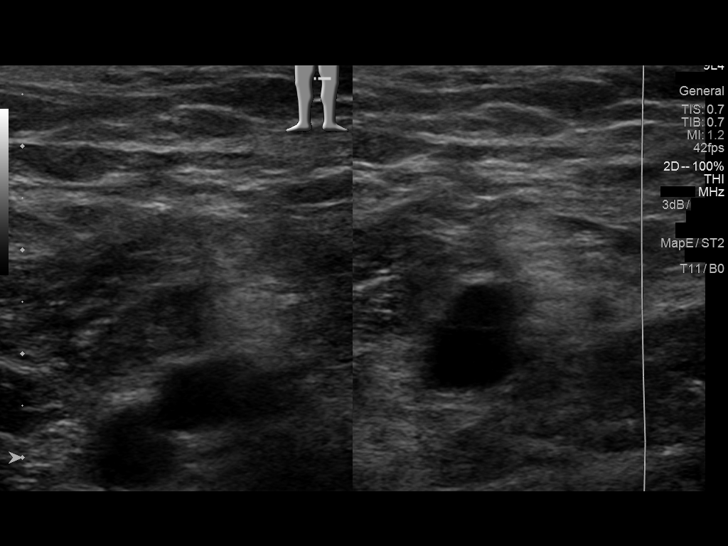
[im 27/48]
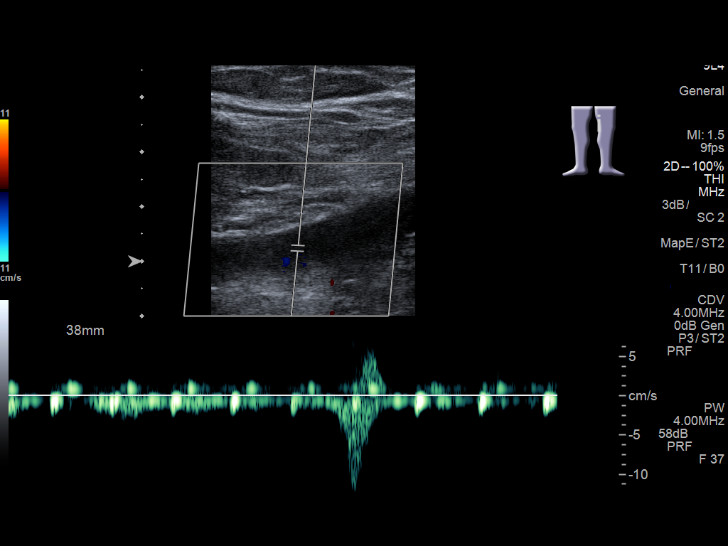
[im 31/48]
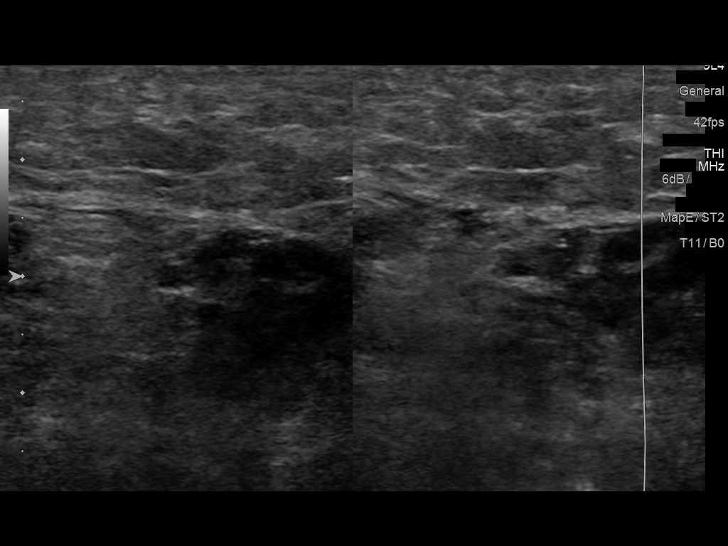
[im 35/48]
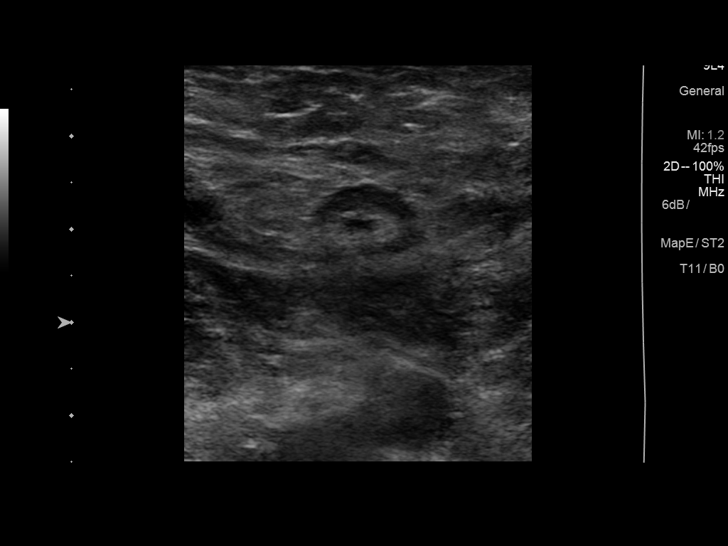
[im 39/48]
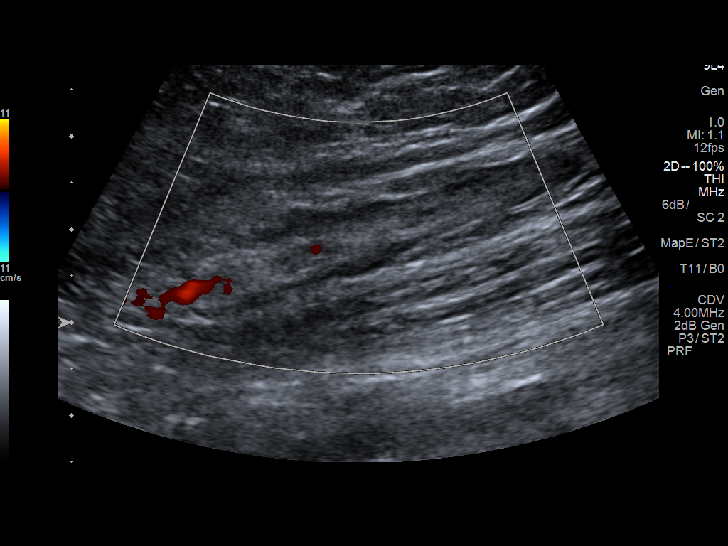
[im 43/48]
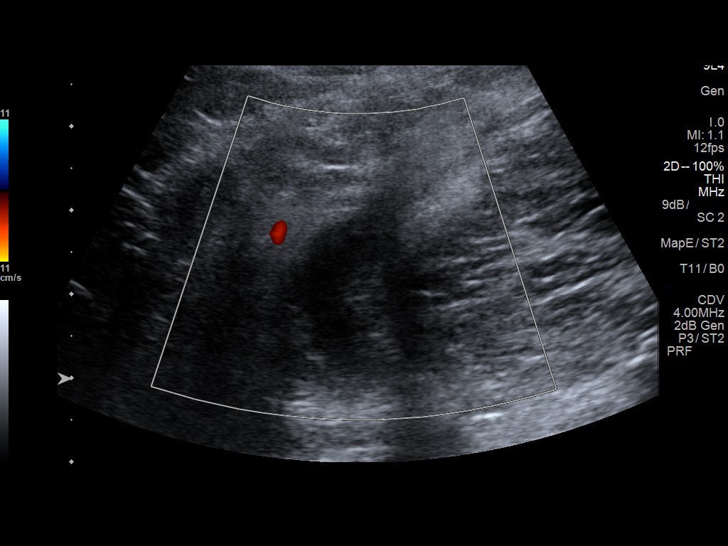
[im 48/48]
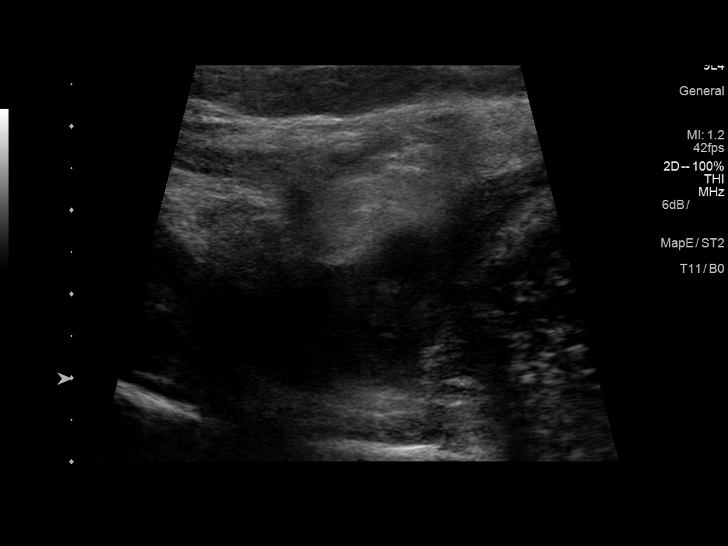

[13 of 24 positions shown; findings below may reference images not displayed]

FINDINGS: Contralateral Common Femoral Vein: Respiratory phasicity is normal
and symmetric with the symptomatic side. No evidence of thrombus.
Normal compressibility.

Common Femoral Vein: No evidence of thrombus. Normal
compressibility, respiratory phasicity and response to augmentation.

Saphenofemoral Junction: No evidence of thrombus. Normal
compressibility and flow on color Doppler imaging.

Profunda Femoral Vein: No evidence of thrombus. Normal
compressibility and flow on color Doppler imaging.

Femoral Vein: No evidence of thrombus. Normal compressibility,
respiratory phasicity and response to augmentation.

Popliteal Vein: No evidence of thrombus. Normal compressibility,
respiratory phasicity and response to augmentation.

Calf Veins: No evidence of thrombus. Normal compressibility and flow
on color Doppler imaging.

Superficial Great Saphenous Vein: No evidence of thrombus. Normal
compressibility and flow on color Doppler imaging.

Other Findings: Complex hypoechoic fluid collection in the left
popliteal fossa measures as great is 2.8 cm. Likely reactive
inguinal lymph nodes.
IMPRESSION: Sonographic survey of the left lower extremity negative for DVT.

Fluid collection left popliteal fossa, likely Baker's cyst.

## 2017-06-10 ENCOUNTER — Other Ambulatory Visit: Payer: Self-pay | Admitting: Family Medicine

## 2017-07-09 ENCOUNTER — Encounter: Payer: Self-pay | Admitting: Family Medicine

## 2017-07-09 ENCOUNTER — Ambulatory Visit (INDEPENDENT_AMBULATORY_CARE_PROVIDER_SITE_OTHER): Payer: BLUE CROSS/BLUE SHIELD | Admitting: Family Medicine

## 2017-07-09 VITALS — BP 156/90 | HR 80 | Temp 98.0°F | Resp 18 | Ht 70.0 in | Wt 252.0 lb

## 2017-07-09 DIAGNOSIS — Z0000 Encounter for general adult medical examination without abnormal findings: Secondary | ICD-10-CM

## 2017-07-09 DIAGNOSIS — Z87891 Personal history of nicotine dependence: Secondary | ICD-10-CM

## 2017-07-09 DIAGNOSIS — Z1211 Encounter for screening for malignant neoplasm of colon: Secondary | ICD-10-CM | POA: Diagnosis not present

## 2017-07-09 DIAGNOSIS — E782 Mixed hyperlipidemia: Secondary | ICD-10-CM

## 2017-07-09 DIAGNOSIS — R030 Elevated blood-pressure reading, without diagnosis of hypertension: Secondary | ICD-10-CM

## 2017-07-09 DIAGNOSIS — Z125 Encounter for screening for malignant neoplasm of prostate: Secondary | ICD-10-CM

## 2017-07-09 DIAGNOSIS — E669 Obesity, unspecified: Secondary | ICD-10-CM | POA: Diagnosis not present

## 2017-07-09 MED ORDER — PRAVASTATIN SODIUM 40 MG PO TABS: 40.0000 mg | ORAL_TABLET | Freq: Every day | ORAL | 10 refills | Status: DC

## 2017-07-09 NOTE — Patient Instructions (Addendum)
You are due for a tetanus vaccine (Td). Please call to schedule at your convenience.    Recommend taking 81mg  enteric coated aspirin to reduce risk of vascular events such as heart attacks and strokes.        DASH Eating Plan DASH stands for "Dietary Approaches to Stop Hypertension." The DASH eating plan is a healthy eating plan that has been shown to reduce high blood pressure (hypertension). It may also reduce your risk for type 2 diabetes, heart disease, and stroke. The DASH eating plan may also help with weight loss. What are tips for following this plan? General guidelines  Avoid eating more than 2,300 mg (milligrams) of salt (sodium) a day. If you have hypertension, you may need to reduce your sodium intake to 1,500 mg a day.  Limit alcohol intake to no more than 1 drink a day for nonpregnant women and 2 drinks a day for men. One drink equals 12 oz of beer, 5 oz of wine, or 1 oz of hard liquor.  Work with your health care provider to maintain a healthy body weight or to lose weight. Ask what an ideal weight is for you.  Get at least 30 minutes of exercise that causes your heart to beat faster (aerobic exercise) most days of the week. Activities may include walking, swimming, or biking.  Work with your health care provider or diet and nutrition specialist (dietitian) to adjust your eating plan to your individual calorie needs. Reading food labels  Check food labels for the amount of sodium per serving. Choose foods with less than 5 percent of the Daily Value of sodium. Generally, foods with less than 300 mg of sodium per serving fit into this eating plan.  To find whole grains, look for the word "whole" as the first word in the ingredient list. Shopping  Buy products labeled as "low-sodium" or "no salt added."  Buy fresh foods. Avoid canned foods and premade or frozen meals. Cooking  Avoid adding salt when cooking. Use salt-free seasonings or herbs instead of table salt or  sea salt. Check with your health care provider or pharmacist before using salt substitutes.  Do not fry foods. Cook foods using healthy methods such as baking, boiling, grilling, and broiling instead.  Cook with heart-healthy oils, such as olive, canola, soybean, or sunflower oil. Meal planning   Eat a balanced diet that includes: ? 5 or more servings of fruits and vegetables each day. At each meal, try to fill half of your plate with fruits and vegetables. ? Up to 6-8 servings of whole grains each day. ? Less than 6 oz of lean meat, poultry, or fish each day. A 3-oz serving of meat is about the same size as a deck of cards. One egg equals 1 oz. ? 2 servings of low-fat dairy each day. ? A serving of nuts, seeds, or beans 5 times each week. ? Heart-healthy fats. Healthy fats called Omega-3 fatty acids are found in foods such as flaxseeds and coldwater fish, like sardines, salmon, and mackerel.  Limit how much you eat of the following: ? Canned or prepackaged foods. ? Food that is high in trans fat, such as fried foods. ? Food that is high in saturated fat, such as fatty meat. ? Sweets, desserts, sugary drinks, and other foods with added sugar. ? Full-fat dairy products.  Do not salt foods before eating.  Try to eat at least 2 vegetarian meals each week.  Eat more home-cooked food and less  restaurant, buffet, and fast food.  When eating at a restaurant, ask that your food be prepared with less salt or no salt, if possible. What foods are recommended? The items listed may not be a complete list. Talk with your dietitian about what dietary choices are best for you. Grains Whole-grain or whole-wheat bread. Whole-grain or whole-wheat pasta. Brown rice. [place]. Bulgur. Whole-grain and low-sodium cereals. Pita bread. Low-fat, low-sodium crackers. Whole-wheat flour tortillas. Vegetables Fresh or frozen vegetables (raw, steamed, roasted, or grilled). Low-sodium or reduced-sodium  tomato and vegetable juice. Low-sodium or reduced-sodium tomato sauce and tomato paste. Low-sodium or reduced-sodium canned vegetables. Fruits All fresh, dried, or frozen fruit. Canned fruit in natural juice (without added sugar). Meat and other protein foods Skinless chicken or [place]. Ground chicken or [place]. Pork with fat trimmed off. Fish and seafood. Egg whites. Dried beans, peas, or lentils. Unsalted nuts, nut butters, and seeds. Unsalted canned beans. Lean cuts of beef with fat trimmed off. Low-sodium, lean deli meat. Dairy Low-fat (1%) or fat-free (skim) milk. Fat-free, low-fat, or reduced-fat cheeses. Nonfat, low-sodium ricotta or cottage cheese. Low-fat or nonfat yogurt. Low-fat, low-sodium cheese. Fats and oils Soft margarine without trans fats. Vegetable oil. Low-fat, reduced-fat, or light mayonnaise and salad dressings (reduced-sodium). Canola, safflower, olive, soybean, and sunflower oils. Avocado. Seasoning and other foods Herbs. Spices. Seasoning mixes without salt. Unsalted popcorn and pretzels. Fat-free sweets. What foods are not recommended? The items listed may not be a complete list. Talk with your dietitian about what dietary choices are best for you. Grains Baked goods made with fat, such as croissants, muffins, or some breads. Dry pasta or rice meal packs. Vegetables Creamed or fried vegetables. Vegetables in a cheese sauce. Regular canned vegetables (not low-sodium or reduced-sodium). Regular canned tomato sauce and paste (not low-sodium or reduced-sodium). Regular tomato and vegetable juice (not low-sodium or reduced-sodium). Angie Fava. Olives. Fruits Canned fruit in a light or heavy syrup. Fried fruit. Fruit in cream or butter sauce. Meat and other protein foods Fatty cuts of meat. Ribs. Fried meat. Berniece Salines. Sausage. Bologna and other processed lunch meats. Salami. Fatback. Hotdogs. Bratwurst. Salted nuts and seeds. Canned beans with added salt. Canned or smoked fish.  Whole eggs or egg yolks. Chicken or [place] with skin. Dairy Whole or 2% milk, cream, and half-and-half. Whole or full-fat cream cheese. Whole-fat or sweetened yogurt. Full-fat cheese. Nondairy creamers. Whipped toppings. Processed cheese and cheese spreads. Fats and oils Butter. Stick margarine. Lard. Shortening. Ghee. Bacon fat. Tropical oils, such as coconut, palm kernel, or palm oil. Seasoning and other foods Salted popcorn and pretzels. Onion salt, garlic salt, seasoned salt, table salt, and sea salt. Worcestershire sauce. Tartar sauce. Barbecue sauce. Teriyaki sauce. Soy sauce, including reduced-sodium. Steak sauce. Canned and packaged gravies. Fish sauce. Oyster sauce. Cocktail sauce. Horseradish that you find on the shelf. Ketchup. Mustard. Meat flavorings and tenderizers. Bouillon cubes. Hot sauce and Tabasco sauce. Premade or packaged marinades. Premade or packaged taco seasonings. Relishes. Regular salad dressings. Where to find more information:  National Heart, Lung, and [place]: https://wilson-eaton.com/  American Heart Association: www.heart.org Summary  The DASH eating plan is a healthy eating plan that has been shown to reduce high blood pressure (hypertension). It may also reduce your risk for type 2 diabetes, heart disease, and stroke.  With the DASH eating plan, you should limit salt (sodium) intake to 2,300 mg a day. If you have hypertension, you may need to reduce your sodium intake to 1,500 mg a day.  When on the DASH eating plan, aim to eat more fresh fruits and vegetables, whole grains, lean proteins, low-fat dairy, and heart-healthy fats.  Work with your health care provider or diet and nutrition specialist (dietitian) to adjust your eating plan to your individual calorie needs. This information is not intended to replace advice given to you by your health care provider. Make sure you discuss any questions you have with your health care provider. Document Released:  05/03/2011 Document Revised: 05/07/2016 Document Reviewed: 05/07/2016 Elsevier Interactive Patient Education  [employer].

## 2017-07-09 NOTE — Progress Notes (Signed)
Patient: Bruce White, Male    DOB: [birth year], 62 y.o.   MRN: [id number] Visit Date: 07/09/2017  Today's Provider: Lelon Huh, MD   Chief Complaint  Patient presents with  . Annual Exam  . Hyperlipidemia  . Obesity   Subjective:    Annual physical exam Bruce White is a 62 y.o. male who presents today for health maintenance and complete physical. He feels well. He reports exercising 3-4 times a week. He reports he is sleeping well.  -----------------------------------------------------------------  Lipid/Cholesterol, Follow-up:   Last seen for this 2 years ago.  Management changes since that visit include no changes. . Last Lipid Panel:    Component Value Date/Time   CHOL 203 (A) 02/03/2015   TRIG 272 (A) 02/03/2015   HDL 45 02/03/2015   LDLCALC 103 02/03/2015    Risk factors for vascular disease include hypercholesterolemia  He reports good compliance with treatment. He is not having side effects.  Current symptoms include none and have been stable. Weight trend: decreasing steadily Prior visit with dietician: no Current diet: in general, a "healthy" diet   Current exercise: cardiovascular workout on exercise equipment  Wt Readings from Last 3 Encounters:  03/11/16 235 lb (106.6 kg)  08/08/15 256 lb 12.8 oz (116.5 kg)  06/14/15 254 lb (115.2 kg)    ------------------------------------------------------------------- Follow up of Obesity:  Patient was last seen for this problem 2 years ago. During that visit, patient was encouraged to eat a healthy diet and exercise. Patient comes in today reporting that he has been watcing his diet and exercising regularly.   Review of Systems  Constitutional: Negative for appetite change, chills, fatigue and fever.  HENT: Negative for congestion, ear pain, hearing loss, nosebleeds and trouble swallowing.   Eyes: Negative for pain and visual disturbance.  Respiratory: Negative for cough, chest tightness  and shortness of breath.   Cardiovascular: Negative for chest pain, palpitations and leg swelling.  Gastrointestinal: Negative for abdominal pain, blood in stool, constipation, diarrhea, nausea and vomiting.  Endocrine: Negative for polydipsia, polyphagia and polyuria.  Genitourinary: Negative for dysuria and flank pain.  Musculoskeletal: Negative for arthralgias, back pain, joint swelling, myalgias and neck stiffness.  Skin: Negative for color change, rash and wound.  Neurological: Negative for dizziness, tremors, seizures, speech difficulty, weakness, light-headedness and headaches.  Psychiatric/Behavioral: Negative for behavioral problems, confusion, decreased concentration, dysphoric mood and sleep disturbance. The patient is not nervous/anxious.   All other systems reviewed and are negative.   Social History      He  reports that he quit smoking about 14 years ago. His smoking use included cigarettes. He has a 60.00 pack-year smoking history. He does not have any smokeless tobacco history on file. He reports that he drinks alcohol. He reports that he does not use drugs.       Social History   Socioeconomic History  . Marital status: Married    Spouse name: None  . Number of children: 2  . Years of education: None  . Highest education level: None  Social Needs  . Financial resource strain: None  . Food insecurity - worry: None  . Food insecurity - inability: None  . Transportation needs - medical: None  . Transportation needs - non-medical: None  Occupational History  . Occupation: [employer]  Tobacco Use  . Smoking status: Former Smoker    Packs/day: 2.00    Years: 30.00    Pack years: 60.00    Types:  Cigarettes    Last attempt to quit: 05/29/2003    Years since quitting: 14.1  . Smokeless tobacco: Never Used  Substance and Sexual Activity  . Alcohol use: Yes    Alcohol/week: 0.0 oz    Comment: occasional use  . Drug use: No  . Sexual activity: None  Other Topics  Concern  . None  Social History Narrative  . None    Past Medical History:  Diagnosis Date  . Kidney stones      Patient Active Problem List   Diagnosis Date Noted  . Knee pain 11/07/2015  . Fam hx-ischem heart disease 06/10/2015  . Personal history of urinary calculi 06/10/2015  . History of tobacco use 06/30/2008  . Obesity 06/04/2008  . Hyperlipidemia, mixed 02/27/2008    Past Surgical History:  Procedure Laterality Date  . BICEPS TENDON REPAIR    . HERNIA REPAIR  0347   Umbilical hernia repair. Surgeon was Dr. Jamal Collin    Family History        Family Status  Relation Name Status  . Mother Josafat Enrico Alive  . Father  Alive  . Brother  Alive  . Other grandfather Alive        His family history includes CAD in his father; Dementia in his father; Heart attack in his brother; Heart disease in his other; Hypertension in his father; Parkinson's disease in his father; Stroke in his father.      Allergies  Allergen Reactions  . Lovastatin      Current Outpatient Medications:  .  aspirin EC 81 MG tablet, Take 81 mg by mouth daily., Disp: , Rfl:  .  meloxicam (MOBIC) 15 MG tablet, Take 1 tablet by mouth daily., Disp: , Rfl:  .  pravastatin (PRAVACHOL) 40 MG tablet, TAKE 1 TABLET BY MOUTH EVERY DAY, Disp: 7 tablet, Rfl: 2 .  oxyCODONE-acetaminophen (ROXICET) 5-325 MG tablet, Take 1 tablet by mouth every 4 (four) hours as needed for severe pain., Disp: 10 tablet, Rfl: 0   Patient Care Team: Birdie Sons, MD as PCP - General (Family Medicine)      Objective:   Vitals: BP (!) 156/90 (BP Location: Left Arm, Cuff Size: Large)   Pulse 80   Temp 98 F (36.7 C) (Oral)   Resp 18   Ht 5\' 10"  (1.778 m)   Wt 252 lb (114.3 kg)   SpO2 96% Comment: room  air  BMI 36.16 kg/m    Vitals:   07/09/17 1458 07/09/17 1503  BP: (!) 142/90 (!) 156/90  Pulse: 80   Resp: 18   Temp: 98 F (36.7 C)   TempSrc: Oral   SpO2: 96%   Weight: 252 lb (114.3 kg)   Height: 5'  10" (1.778 m)      Physical Exam   General Appearance:    Alert, cooperative, no distress, appears stated age, obese  Head:    Normocephalic, without obvious abnormality, atraumatic  Eyes:    PERRL, conjunctiva/corneas clear, EOM's intact, fundi    benign, both eyes       Ears:    Normal TM's and external ear canals, both ears  Nose:   Nares normal, septum midline, mucosa normal, no drainage   or sinus tenderness  Throat:   Lips, mucosa, and tongue normal; teeth and gums normal  Neck:   Supple, symmetrical, trachea midline, no adenopathy;       thyroid:  No enlargement/tenderness/nodules; no carotid   bruit or JVD  Back:  Symmetric, no curvature, ROM normal, no CVA tenderness  Lungs:     Clear to auscultation bilaterally, respirations unlabored  Chest wall:    No tenderness or deformity  Heart:    Regular rate and rhythm, S1 and S2 normal, no murmur, rub   or gallop  Abdomen:     Soft, non-tender, bowel sounds active all four quadrants,    no masses, no organomegaly  Genitalia:    deferred  Rectal:    deferred  Extremities:   Extremities normal, atraumatic, no cyanosis or edema  Pulses:   2+ and symmetric all extremities  Skin:   Skin color, texture, turgor normal, no rashes or lesions  Lymph nodes:   Cervical, supraclavicular, and axillary nodes normal  Neurologic:   CNII-XII intact. Normal strength, sensation and reflexes      throughout   Depression Screen PHQ 2/9 Scores 07/09/2017 06/14/2015  PHQ - 2 Score 0 0  PHQ- 9 Score 0 0      Assessment & Plan:     Routine Health Maintenance and Physical Exam  Exercise Activities and Dietary recommendations Goals    None      Immunization History  Administered Date(s) Administered  . Influenza-Unspecified 03/12/2015  . Tdap 05/01/2005  . Zoster 06/14/2015    Health Maintenance  Topic Date Due  . HIV Screening  01/25/1970  . TETANUS/TDAP  05/02/2015  . COLONOSCOPY  10/22/2015  . INFLUENZA VACCINE  12/26/2016    . Hepatitis C Screening  Completed     Discussed health benefits of physical activity, and encouraged him to engage in regular exercise appropriate for his age and condition.    --------------------------------------------------------------------  1. Annual physical exam Recommended Td which he declined.  - EKG 12-Lead  2. Screening for colon cancer  - Ambulatory referral to Gastroenterology  3. Elevated blood pressure reading He reports home blood pressure are usually much better in the 130s. Given information regarding DASH diet  - EKG 12-Lead  4. Prostate cancer screening  - PSA  5. Hyperlipidemia, mixed He is tolerating pravastatin well with no adverse effects.    6. History of tobacco use Counseled on recommendations for LDCT lung cancer screening for ex-smokers who quit less than 15 years ago. He reports he quit in 2005 and is eligible for screening which he declined.   7. Obesity (BMI 35.0-39.9 without comorbidity) Counseled regarding prudent diet and regular exercise. \   Lelon Huh, MD  [facility]

## 2017-08-16 ENCOUNTER — Other Ambulatory Visit: Payer: Self-pay

## 2018-07-24 ENCOUNTER — Other Ambulatory Visit: Payer: Self-pay | Admitting: Family Medicine

## 2018-09-22 ENCOUNTER — Other Ambulatory Visit: Payer: Self-pay | Admitting: Family Medicine

## 2018-12-20 ENCOUNTER — Other Ambulatory Visit: Payer: Self-pay | Admitting: Family Medicine

## 2019-01-15 ENCOUNTER — Other Ambulatory Visit: Payer: Self-pay | Admitting: Family Medicine

## 2019-02-15 ENCOUNTER — Other Ambulatory Visit: Payer: Self-pay | Admitting: Family Medicine

## 2019-02-26 DIAGNOSIS — Z20828 Contact with and (suspected) exposure to other viral communicable diseases: Secondary | ICD-10-CM | POA: Diagnosis not present

## 2019-03-10 DIAGNOSIS — Z20828 Contact with and (suspected) exposure to other viral communicable diseases: Secondary | ICD-10-CM | POA: Diagnosis not present

## 2019-03-13 ENCOUNTER — Encounter: Payer: Self-pay | Admitting: Family Medicine

## 2019-03-13 ENCOUNTER — Other Ambulatory Visit: Payer: Self-pay

## 2019-03-13 ENCOUNTER — Ambulatory Visit (INDEPENDENT_AMBULATORY_CARE_PROVIDER_SITE_OTHER): Payer: BC Managed Care – PPO | Admitting: Family Medicine

## 2019-03-13 VITALS — BP 126/70 | HR 58 | Temp 97.5°F | Resp 16 | Ht 70.0 in | Wt 246.0 lb

## 2019-03-13 DIAGNOSIS — Z111 Encounter for screening for respiratory tuberculosis: Secondary | ICD-10-CM | POA: Diagnosis not present

## 2019-03-13 DIAGNOSIS — Z125 Encounter for screening for malignant neoplasm of prostate: Secondary | ICD-10-CM

## 2019-03-13 DIAGNOSIS — Z0000 Encounter for general adult medical examination without abnormal findings: Secondary | ICD-10-CM

## 2019-03-13 DIAGNOSIS — Z23 Encounter for immunization: Secondary | ICD-10-CM | POA: Diagnosis not present

## 2019-03-13 DIAGNOSIS — Z1211 Encounter for screening for malignant neoplasm of colon: Secondary | ICD-10-CM

## 2019-03-13 DIAGNOSIS — E782 Mixed hyperlipidemia: Secondary | ICD-10-CM | POA: Diagnosis not present

## 2019-03-13 NOTE — Progress Notes (Signed)
Patient: Bruce White, Male    DOB: Jun 10, 1955, 63 y.o.   MRN: [id number] Visit Date: 03/13/2019  Today's Provider: Lelon Huh, MD   Chief Complaint  Patient presents with  . Annual Exam   Subjective:     Annual physical exam Bruce White is a 63 y.o. male who presents today for health maintenance and complete physical. He feels fairly well. He reports exercising daily. He reports he is sleeping fairly well.  -----------------------------------------------------------------  Follow up for Elevated Blood pressure:  The patient was last seen for this 8 months ago. Changes made at last visit include none; patient was given information regarding DASH diet.  He reports good compliance with treatment. He feels that condition is Improved. He is not having side effects.   ------------------------------------------------------------------------------------  Lipid/Cholesterol, Follow-up:   Last seen for this 8 months ago.  Management changes since that visit include none. . Last Lipid Panel:    Component Value Date/Time   CHOL 203 (A) 02/03/2015   TRIG 272 (A) 02/03/2015   HDL 45 02/03/2015   LDLCALC 103 02/03/2015    Risk factors for vascular disease include hypercholesterolemia  He reports good compliance with treatment. He is not having side effects.  Current symptoms include none and have been stable. Weight trend: decreasing steadily Prior visit with dietician: no Current diet: well balanced Current exercise: walking  Wt Readings from Last 3 Encounters:  03/13/19 246 lb (111.6 kg)  07/09/17 252 lb (114.3 kg)  03/11/16 235 lb (106.6 kg)    -------------------------------------------------------------------  Follow up for Obesity:  The patient was last seen for this 8 months ago. Changes made at last visit include non; patient was advised to get regular exercise and follow a prudent diet.  He reports good compliance with treatment. He  feels that condition is Improved. He is not having side effects.   ------------------------------------------------------------------------------------   Review of Systems  Constitutional: Negative for appetite change, chills, fatigue and fever.  HENT: Negative for congestion, ear pain, hearing loss, nosebleeds and trouble swallowing.   Eyes: Negative for pain and visual disturbance.  Respiratory: Negative for cough, chest tightness and shortness of breath.   Cardiovascular: Negative for chest pain, palpitations and leg swelling.  Gastrointestinal: Negative for abdominal pain, blood in stool, constipation, diarrhea, nausea and vomiting.  Endocrine: Negative for polydipsia, polyphagia and polyuria.  Genitourinary: Negative for dysuria and flank pain.  Musculoskeletal: Negative for arthralgias, back pain, joint swelling, myalgias and neck stiffness.  Skin: Negative for color change, rash and wound.  Neurological: Negative for dizziness, tremors, seizures, speech difficulty, weakness, light-headedness and headaches.  Psychiatric/Behavioral: Negative for behavioral problems, confusion, decreased concentration, dysphoric mood and sleep disturbance. The patient is not nervous/anxious.   All other systems reviewed and are negative.   Social History      He  reports that he quit smoking about 15 years ago. His smoking use included cigarettes. He has a 60.00 pack-year smoking history. He has never used smokeless tobacco. He reports current alcohol use. He reports that he does not use drugs.       Social History   Socioeconomic History  . Marital status: Married    Spouse name: Not on file  . Number of children: 2  . Years of education: Not on file  . Highest education level: Not on file  Occupational History  . Occupation: [employer]  Social Needs  . Financial resource strain: Not on file  . Food insecurity  Worry: Not on file    Inability: Not on file  . Transportation needs     Medical: Not on file    Non-medical: Not on file  Tobacco Use  . Smoking status: Former Smoker    Packs/day: 2.00    Years: 30.00    Pack years: 60.00    Types: Cigarettes    Quit date: 05/29/2003    Years since quitting: 15.8  . Smokeless tobacco: Never Used  Substance and Sexual Activity  . Alcohol use: Yes    Alcohol/week: 0.0 standard drinks    Comment: occasional use  . Drug use: No  . Sexual activity: Not on file  Lifestyle  . Physical activity    Days per week: Not on file    Minutes per session: Not on file  . Stress: Not on file  Relationships  . Social Herbalist on phone: Not on file    Gets together: Not on file    Attends religious service: Not on file    Active member of club or organization: Not on file    Attends meetings of clubs or organizations: Not on file    Relationship status: Not on file  Other Topics Concern  . Not on file  Social History Narrative  . Not on file    Past Medical History:  Diagnosis Date  . Kidney stones      Patient Active Problem List   Diagnosis Date Noted  . Knee pain 11/07/2015  . Fam hx-ischem heart disease 06/10/2015  . Personal history of urinary calculi 06/10/2015  . History of tobacco use 06/30/2008  . Obesity 06/04/2008  . Hyperlipidemia, mixed 02/27/2008    Past Surgical History:  Procedure Laterality Date  . BICEPS TENDON REPAIR    . HERNIA REPAIR  [id number]   Umbilical hernia repair. Surgeon was Dr. Jamal Collin    Family History        Family Status  Relation Name Status  . Mother Eirik Tinker Alive  . Father  Alive  . Brother  Alive  . Other grandfather Alive        His family history includes CAD in his father; Dementia in his father; Heart attack in his brother; Heart disease in an other family member; Hypertension in his father; Parkinson's disease in his father; Stroke in his father.      Allergies  Allergen Reactions  . Lovastatin      Current Outpatient Medications:  .  aspirin  EC 81 MG tablet, Take 81 mg by mouth daily., Disp: , Rfl:  .  meloxicam (MOBIC) 15 MG tablet, Take 1 tablet by mouth daily., Disp: , Rfl:  .  pravastatin (PRAVACHOL) 40 MG tablet, TAKE 1 TABLET BY MOUTH EVERY DAY, Disp: 30 tablet, Rfl: 0   Patient Care Team: Birdie Sons, MD as PCP - General (Family Medicine)    Objective:    Vitals: BP 126/70 (BP Location: Left Arm, Patient Position: Sitting, Cuff Size: Large)   Pulse (!) 58   Temp (!) 97.5 F (36.4 C) (Temporal)   Resp 16   Ht 5\' 10"  (1.778 m)   Wt 246 lb (111.6 kg)   SpO2 98% Comment: room air  BMI 35.30 kg/m    Vitals:   03/13/19 1004  BP: 126/70  Pulse: (!) 58  Resp: 16  Temp: (!) 97.5 F (36.4 C)  TempSrc: Temporal  SpO2: 98%  Weight: 246 lb (111.6 kg)  Height: 5\' 10"  (1.778 m)  Physical Exam   General Appearance:    Obese male. Alert, cooperative, in no acute distress, appears stated age  Head:    Normocephalic, without obvious abnormality, atraumatic  Eyes:    PERRL, conjunctiva/corneas clear, EOM's intact, fundi    benign, both eyes       Ears:    Normal TM's and external ear canals, both ears  Nose:   Nares normal, septum midline, mucosa normal, no drainage   or sinus tenderness  Throat:   Lips, mucosa, and tongue normal; teeth and gums normal  Neck:   Supple, symmetrical, trachea midline, no adenopathy;       thyroid:  No enlargement/tenderness/nodules; no carotid   bruit or JVD  Back:     Symmetric, no curvature, ROM normal, no CVA tenderness  Lungs:     Clear to auscultation bilaterally, respirations unlabored  Chest wall:    No tenderness or deformity  Heart:    Bradycardic. Normal rhythm. No murmurs, rubs, or gallops.  S1 and S2 normal  Abdomen:     Soft, non-tender, bowel sounds active all four quadrants,    no masses, no organomegaly  Genitalia:    deferred  Rectal:    deferred  Extremities:   All extremities are intact. No cyanosis or edema  Pulses:   2+ and symmetric all extremities   Skin:   Skin color, texture, turgor normal, no rashes or lesions  Lymph nodes:   Cervical, supraclavicular, and axillary nodes normal  Neurologic:   CNII-XII intact. Normal strength, sensation and reflexes      throughout   Depression Screen PHQ 2/9 Scores 03/13/2019 07/09/2017 06/14/2015  PHQ - 2 Score 0 0 0  PHQ- 9 Score 0 0 0       Assessment & Plan:     Routine Health Maintenance and Physical Exam  Exercise Activities and Dietary recommendations Goals   None     Immunization History  Administered Date(s) Administered  . Influenza,inj,Quad PF,6+ Mos 03/26/2018  . Influenza-Unspecified 03/12/2015, 02/25/2017  . Tdap 05/01/2005  . Zoster 06/14/2015    Health Maintenance  Topic Date Due  . HIV Screening  01/25/1970  . TETANUS/TDAP  05/02/2015  . COLONOSCOPY  10/22/2015  . INFLUENZA VACCINE  12/27/2018  . Hepatitis C Screening  Completed     Discussed health benefits of physical activity, and encouraged him to engage in regular exercise appropriate for his age and condition.    --------------------------------------------------------------------  1. Annual physical exam Unremarkable exam aside from obesity. Counseled to work on diet and exercise.   2. Hyperlipidemia, mixed He is tolerating pravastatin well with no adverse effects.   - Comprehensive metabolic panel - Lipid panel  3. Need for prophylactic vaccination using tetanus and diphtheria toxoids adsorbed (Td) vaccine  - Tdap vaccine greater than or equal to 7yo IM  4. Screening-pulmonary TB Is starting new job later this month and needs TB screening with medical form that will need to be completed.  - QuantiFERON-TB Gold Plus  5. Prostate cancer screening  - PSA  6. Need for influenza vaccination  - Flu Vaccine QUAD 6+ mos PF IM (Fluarix Quad PF)  7. Colon cancer screening Last colonoscopy was by Dr. Bary Castilla in 2007. He prefers Q10 colonoscopy over Q 3 yr Cologuard.  - Ambulatory referral to  Gastroenterology   Lelon Huh, MD  [facility]

## 2019-03-13 NOTE — Patient Instructions (Signed)
.   Please review the attached list of medications and notify my office if there are any errors.   . Please bring all of your medications to every appointment so we can make sure that our medication list is the same as yours.   . It is especially important to get the annual flu vaccine this year. If you haven't had it already, please go to your pharmacy or call the office as soon as possible to schedule you flu shot.  

## 2019-03-17 ENCOUNTER — Telehealth: Payer: Self-pay

## 2019-03-17 LAB — COMPREHENSIVE METABOLIC PANEL
ALT: 20 IU/L (ref 0–44)
AST: 25 IU/L (ref 0–40)
Albumin/Globulin Ratio: 1.5 (ref 1.2–2.2)
Albumin: 4.2 g/dL (ref 3.8–4.8)
Alkaline Phosphatase: 92 IU/L (ref 39–117)
BUN/Creatinine Ratio: 13 (ref 10–24)
BUN: 17 mg/dL (ref 8–27)
Bilirubin Total: 0.6 mg/dL (ref 0.0–1.2)
CO2: 20 mmol/L (ref 20–29)
Calcium: 9.3 mg/dL (ref 8.6–10.2)
Chloride: 106 mmol/L (ref 96–106)
Creatinine, Ser: 1.26 mg/dL (ref 0.76–1.27)
GFR calc Af Amer: 69 mL/min/{1.73_m2} (ref >59)
GFR calc non Af Amer: 60 mL/min/{1.73_m2} (ref >59)
Globulin, Total: 2.8 g/dL (ref 1.5–4.5)
Glucose: 86 mg/dL (ref 65–99)
Potassium: 4.6 mmol/L (ref 3.5–5.2)
Sodium: 140 mmol/L (ref 134–144)
Total Protein: 7 g/dL (ref 6.0–8.5)

## 2019-03-17 LAB — LIPID PANEL
Chol/HDL Ratio: 4 ratio (ref 0.0–5.0)
Cholesterol, Total: 187 mg/dL (ref 100–199)
HDL: 47 mg/dL (ref >39)
LDL Chol Calc (NIH): 125 mg/dL — ABNORMAL HIGH (ref 0–99)
Triglycerides: 84 mg/dL (ref 0–149)
VLDL Cholesterol Cal: 15 mg/dL (ref 5–40)

## 2019-03-17 LAB — QUANTIFERON-TB GOLD PLUS
QuantiFERON Mitogen Value: >10 IU/mL
QuantiFERON Nil Value: 0.02 IU/mL
QuantiFERON TB1 Ag Value: 0.02 IU/mL
QuantiFERON TB2 Ag Value: 0.01 IU/mL
QuantiFERON-TB Gold Plus: NEGATIVE

## 2019-03-17 LAB — PSA: Prostate Specific Ag, Serum: 1 ng/mL (ref 0.0–4.0)

## 2019-03-17 NOTE — Telephone Encounter (Signed)
Patient advised.

## 2019-03-17 NOTE — Telephone Encounter (Signed)
Pt returned missed call. Please call pt back on his cell phone at [phone].  Thanks, [employer]

## 2019-03-17 NOTE — Telephone Encounter (Signed)
-----   Message from Birdie Sons, MD sent at 03/17/2019  9:16 AM EDT ----- Cholesterol pretty well controlled. psa is normal. Continue current medications.   TB screening is normal. He can pick up work form at his convenience

## 2019-03-17 NOTE — Telephone Encounter (Signed)
LMTCB

## 2019-03-24 ENCOUNTER — Other Ambulatory Visit: Payer: Self-pay | Admitting: Family Medicine

## 2019-03-31 ENCOUNTER — Encounter: Payer: Self-pay | Admitting: *Deleted

## 2019-04-20 DIAGNOSIS — Z20828 Contact with and (suspected) exposure to other viral communicable diseases: Secondary | ICD-10-CM | POA: Diagnosis not present

## 2020-05-31 ENCOUNTER — Other Ambulatory Visit: Payer: Self-pay

## 2020-05-31 ENCOUNTER — Encounter: Payer: Self-pay | Admitting: Family Medicine

## 2020-05-31 ENCOUNTER — Ambulatory Visit (INDEPENDENT_AMBULATORY_CARE_PROVIDER_SITE_OTHER): Payer: Self-pay | Admitting: Family Medicine

## 2020-05-31 VITALS — BP 125/82 | HR 62 | Temp 98.1°F | Resp 16 | Ht 70.0 in | Wt 255.6 lb

## 2020-05-31 DIAGNOSIS — Z111 Encounter for screening for respiratory tuberculosis: Secondary | ICD-10-CM

## 2020-05-31 DIAGNOSIS — Z125 Encounter for screening for malignant neoplasm of prostate: Secondary | ICD-10-CM

## 2020-05-31 DIAGNOSIS — Z23 Encounter for immunization: Secondary | ICD-10-CM

## 2020-05-31 DIAGNOSIS — Z1211 Encounter for screening for malignant neoplasm of colon: Secondary | ICD-10-CM

## 2020-05-31 DIAGNOSIS — E782 Mixed hyperlipidemia: Secondary | ICD-10-CM

## 2020-05-31 DIAGNOSIS — Z0000 Encounter for general adult medical examination without abnormal findings: Secondary | ICD-10-CM

## 2020-05-31 NOTE — Progress Notes (Signed)
Complete physical exam   Patient: Bruce White   DOB: 1956/04/12   65 y.o. Male  MRN: [id number] Visit Date: 05/31/2020  Today's healthcare provider: Mila Merry, MD   Chief Complaint  Patient presents with  . Annual Exam  . Hyperlipidemia   Subjective    Bruce White is a 65 y.o. male who presents today for a complete physical exam.  He reports consuming a general diet. The patient does not participate in regular exercise at present. He generally feels fairly well. He reports sleeping fairly well. He does not have additional problems to discuss today.  HPI  Lipid/Cholesterol, Follow-up  Last lipid panel Other pertinent labs  Lab Results  Component Value Date   CHOL 187 03/13/2019   HDL 47 03/13/2019   LDLCALC 125 (H) 03/13/2019   TRIG 84 03/13/2019   CHOLHDL 4.0 03/13/2019   Lab Results  Component Value Date   ALT 20 03/13/2019   AST 25 03/13/2019   TSH 2.530 06/14/2015     He was last seen for this 03/17/2019. Management since that visit includes continue same medication.  He reports good compliance with treatment. He is not having side effects.   Symptoms: No chest pain No chest pressure/discomfort  No dyspnea No lower extremity edema  No numbness or tingling of extremity No orthopnea  No palpitations No paroxysmal nocturnal dyspnea  No speech difficulty No syncope   Current diet: well balanced Current exercise: none  The 10-year ASCVD risk score Denman George DC Montez Hageman., et al., 2013) is: 12.1%  ---------------------------------------------------------------------------------------------------  Past Medical History:  Diagnosis Date  . Kidney stones    Past Surgical History:  Procedure Laterality Date  . BICEPS TENDON REPAIR    . HERNIA REPAIR  2005   Umbilical hernia repair. Surgeon was Dr. Evette Cristal   Social History   Socioeconomic History  . Marital status: Married    Spouse name: Not on file  . Number of children: 2  . Years of education:  Not on file  . Highest education level: Not on file  Occupational History  . Occupation: [employer]  Tobacco Use  . Smoking status: Former Smoker    Packs/day: 2.00    Years: 30.00    Pack years: 60.00    Types: Cigarettes    Quit date: 05/29/2003    Years since quitting: 17.0  . Smokeless tobacco: Never Used  Substance and Sexual Activity  . Alcohol use: Yes    Alcohol/week: 0.0 standard drinks    Comment: occasional use  . Drug use: No  . Sexual activity: Not on file  Other Topics Concern  . Not on file  Social History Narrative  . Not on file   Social Determinants of Health   Financial Resource Strain: Not on file  Food Insecurity: Not on file  Transportation Needs: Not on file  Physical Activity: Not on file  Stress: Not on file  Social Connections: Not on file  Intimate Partner Violence: Not on file   Family Status  Relation Name Status  . Mother Deke Tilghman Alive  . Father  Alive  . Brother  Alive  . Other grandfather Alive   Family History  Problem Relation Age of Onset  . Hypertension Father   . CAD Father   . Dementia Father   . Stroke Father   . Parkinson's disease Father   . Heart attack Brother   . Heart disease Other    Allergies  Allergen Reactions  .  Lovastatin     Patient Care Team: Birdie Sons, MD as PCP - General (Family Medicine)   Medications: Outpatient Medications Prior to Visit  Medication Sig  . aspirin EC 81 MG tablet Take 81 mg by mouth daily.  . pravastatin (PRAVACHOL) 40 MG tablet TAKE 1 TABLET BY MOUTH EVERY DAY  . [DISCONTINUED] meloxicam (MOBIC) 15 MG tablet Take 1 tablet by mouth daily. (Patient not taking: Reported on 05/31/2020)   No facility-administered medications prior to visit.    Review of Systems  Constitutional: Negative for appetite change, chills, fatigue and fever.  HENT: Negative for congestion, ear pain, hearing loss, nosebleeds and trouble swallowing.   Eyes: Negative for pain and visual  disturbance.  Respiratory: Negative for cough, chest tightness and shortness of breath.   Cardiovascular: Negative for chest pain, palpitations and leg swelling.  Gastrointestinal: Negative for abdominal pain, blood in stool, constipation, diarrhea, nausea and vomiting.  Endocrine: Negative for polydipsia, polyphagia and polyuria.  Genitourinary: Negative for dysuria and flank pain.  Musculoskeletal: Negative for arthralgias, back pain, joint swelling, myalgias and neck stiffness.  Skin: Negative for color change, rash and wound.  Neurological: Negative for dizziness, tremors, seizures, speech difficulty, weakness, light-headedness and headaches.  Psychiatric/Behavioral: Negative for behavioral problems, confusion, decreased concentration, dysphoric mood and sleep disturbance. The patient is not nervous/anxious.   All other systems reviewed and are negative.     Objective    BP 125/82   Pulse 62   Temp 98.1 F (36.7 C) (Temporal)   Resp 16   Ht 5\' 10"  (1.778 m)   Wt 255 lb 9.6 oz (115.9 kg)   BMI 36.67 kg/m    Physical Exam   General Appearance:    Mildly obese male. Alert, cooperative, in no acute distress, appears stated age  Head:    Normocephalic, without obvious abnormality, atraumatic  Eyes:    PERRL, conjunctiva/corneas clear, EOM's intact, fundi    benign, both eyes       Ears:    Normal TM's and external ear canals, both ears  Neck:   Supple, symmetrical, trachea midline, no adenopathy;       thyroid:  No enlargement/tenderness/nodules; no carotid   bruit or JVD  Back:     Symmetric, no curvature, ROM normal, no CVA tenderness  Lungs:     Clear to auscultation bilaterally, respirations unlabored  Chest wall:    No tenderness or deformity  Heart:    Normal heart rate. Normal rhythm. No murmurs, rubs, or gallops.  S1 and S2 normal  Abdomen:     Soft, non-tender, bowel sounds active all four quadrants,    no masses, no organomegaly  Genitalia:    deferred  Rectal:     deferred  Extremities:   All extremities are intact. No cyanosis or edema  Pulses:   2+ and symmetric all extremities  Skin:   Skin color, texture, turgor normal, no rashes or lesions  Lymph nodes:   Cervical, supraclavicular, and axillary nodes normal  Neurologic:   CNII-XII intact. Normal strength, sensation and reflexes      throughout     Last depression screening scores PHQ 2/9 Scores 05/31/2020 03/13/2019 07/09/2017  PHQ - 2 Score 0 0 0  PHQ- 9 Score 0 0 0   Last fall risk screening Fall Risk  05/31/2020  Falls in the past year? 0  Number falls in past yr: 0  Injury with Fall? 0  Follow up Falls evaluation completed   Last  Audit-C alcohol use screening Alcohol Use Disorder Test (AUDIT) 05/31/2020  1. How often do you have a drink containing alcohol? 1  2. How many drinks containing alcohol do you have on a typical day when you are drinking? 0  3. How often do you have six or more drinks on one occasion? 0  AUDIT-C Score 1  Alcohol Brief Interventions/Follow-up AUDIT Score <7 follow-up not indicated   A score of 3 or more in women, and 4 or more in men indicates increased risk for alcohol abuse, EXCEPT if all of the points are from question 1   No results found for any visits on 05/31/20.  Assessment & Plan    Routine Health Maintenance and Physical Exam  Exercise Activities and Dietary recommendations Goals   None     Immunization History  Administered Date(s) Administered  . Influenza,inj,Quad PF,6+ Mos 03/26/2018, 03/13/2019  . Influenza-Unspecified 03/12/2015, 02/25/2017  . Tdap 05/01/2005, 03/13/2019  . Zoster 06/14/2015    Health Maintenance  Topic Date Due  . COVID-19 Vaccine (1) Never done  . HIV Screening  Never done  . COLONOSCOPY (Pts 45-63yrs Insurance coverage will need to be confirmed)  10/22/2015  . INFLUENZA VACCINE  12/27/2019  . PNA vac Low Risk Adult (1 of 2 - PCV13) Never done  . TETANUS/TDAP  03/12/2029  . Hepatitis C Screening  Completed     Discussed health benefits of physical activity, and encouraged him to engage in regular exercise appropriate for his age and condition.  1. Annual physical exam He states he has had 3 doses of Moderna Covid vaccine. Recommended flu vaccine and pneumonia vaccine and he declined both.   2. Colon cancer screening  - Ambulatory referral to gastroenterology for colonoscopy  3. Prostate cancer screening  - PSA Total (Reflex To Free) ([facility] only)  4. Screening-pulmonary TB  - QuantiFERON-TB Gold Plus  5. Hyperlipidemia, mixed He is tolerating pravastatin well with no adverse effects.   - Comprehensive metabolic panel - Lipid panel  6. Need for influenza vaccination Recommended flu vaccine which he declined.        The entirety of the information documented in the History of Present Illness, Review of Systems and Physical Exam were personally obtained by me. Portions of this information were initially documented by the CMA and reviewed by me for thoroughness and accuracy.      Bruce Huh, MD  [facility] [phone] (phone) [phone] (fax)  [place]

## 2020-06-01 ENCOUNTER — Ambulatory Visit: Payer: Self-pay | Admitting: *Deleted

## 2020-06-01 LAB — QUANTIFERON-TB GOLD PLUS

## 2020-06-01 LAB — PSA TOTAL (REFLEX TO FREE): Prostate Specific Ag, Serum: 0.8 ng/mL (ref 0.0–4.0)

## 2020-06-01 LAB — COMPREHENSIVE METABOLIC PANEL
CO2: 23 mmol/L (ref 20–29)
Calcium: 9.5 mg/dL (ref 8.6–10.2)
Chloride: 102 mmol/L (ref 96–106)
Creatinine, Ser: 1.31 mg/dL — ABNORMAL HIGH (ref 0.76–1.27)

## 2020-06-01 LAB — LIPID PANEL
Chol/HDL Ratio: 5.2 ratio — ABNORMAL HIGH (ref 0.0–5.0)
Cholesterol, Total: 201 mg/dL — ABNORMAL HIGH (ref 100–199)

## 2020-06-01 NOTE — Telephone Encounter (Signed)
Patient called on home number and patient's wife states that patient was not at home currently. Patient's wife provided patient's cell phone number, [phone] to call patient regarding symptoms.  Patient's cell phone called, but no answer. Left message for patient to return call to discuss symptoms.

## 2020-06-01 NOTE — Telephone Encounter (Signed)
Called patient back for c/o swelling and pain in his nose and under eyes per wife. C/o right side in nose swollen and painful to touch. Denies drainage, bleeding, watering eyes, sneezing. Denies fever, difficulty breathing. Patient reports he was covid tested Saturday and was not sure if that may have caused any pain. Patient reports he was just seen by PCP yesterday and forgot to mention. Please notify patient if he needs another appt. Care advise given. Patient verbalized understanding of care advise and to call back or go to [facility] or ED if symptoms worsen.  Reason for Disposition . Nursing judgment or information in reference  Answer Assessment - Initial Assessment Questions 1. REASON FOR CALL: "What is your main concern right now?"     Swelling and pain in right side of nose   3. SEVERITY: "How bad is the pain ?"     Painful to touch 4. FEVER: "Do you have a fever?"     no 5. OTHER SYMPTOMS: "Do you have any other new symptoms?"     Swollen and painful 6. INTERVENTIONS AND RESPONSE: "What have you done so far to try to make this better? What medications have you used?"     na  Protocols used: NO GUIDELINE AVAILABLE-A-AH

## 2020-06-02 LAB — COMPREHENSIVE METABOLIC PANEL
ALT: 20 IU/L (ref 0–44)
AST: 30 IU/L (ref 0–40)
Albumin/Globulin Ratio: 1.5 (ref 1.2–2.2)
Albumin: 4.4 g/dL (ref 3.8–4.8)
Alkaline Phosphatase: 94 IU/L (ref 44–121)
BUN/Creatinine Ratio: 9 — ABNORMAL LOW (ref 10–24)
BUN: 12 mg/dL (ref 8–27)
Bilirubin Total: 0.5 mg/dL (ref 0.0–1.2)
GFR calc Af Amer: 66 mL/min/{1.73_m2} (ref >59)
GFR calc non Af Amer: 57 mL/min/{1.73_m2} — ABNORMAL LOW (ref >59)
Globulin, Total: 3 g/dL (ref 1.5–4.5)
Glucose: 87 mg/dL (ref 65–99)
Potassium: 4.7 mmol/L (ref 3.5–5.2)
Sodium: 138 mmol/L (ref 134–144)
Total Protein: 7.4 g/dL (ref 6.0–8.5)

## 2020-06-02 LAB — LIPID PANEL
HDL: 39 mg/dL — ABNORMAL LOW (ref >39)
LDL Chol Calc (NIH): 108 mg/dL — ABNORMAL HIGH (ref 0–99)
Triglycerides: 317 mg/dL — ABNORMAL HIGH (ref 0–149)
VLDL Cholesterol Cal: 54 mg/dL — ABNORMAL HIGH (ref 5–40)

## 2020-06-02 LAB — QUANTIFERON-TB GOLD PLUS
QuantiFERON Mitogen Value: >10 IU/mL
QuantiFERON Nil Value: 0.05 IU/mL
QuantiFERON TB1 Ag Value: 0.04 IU/mL
QuantiFERON TB2 Ag Value: 0.03 IU/mL
QuantiFERON-TB Gold Plus: NEGATIVE

## 2020-06-02 LAB — PSA TOTAL (REFLEX TO FREE)

## 2020-06-02 MED ORDER — SULFAMETHOXAZOLE-TRIMETHOPRIM 800-160 MG PO TABS: 1.0000 | ORAL_TABLET | Freq: Two times a day (BID) | ORAL | 0 refills | Status: AC

## 2020-06-02 NOTE — Telephone Encounter (Signed)
Assuming he has not had any injury, this is usually do to staph infection around the nose, [facility]. Let me know if not clearing up within the next week.

## 2020-06-03 NOTE — Telephone Encounter (Signed)
Advised patient' wife as below.

## 2020-06-17 ENCOUNTER — Other Ambulatory Visit: Payer: Self-pay

## 2020-06-17 ENCOUNTER — Telehealth (INDEPENDENT_AMBULATORY_CARE_PROVIDER_SITE_OTHER): Payer: Self-pay | Admitting: Gastroenterology

## 2020-06-17 DIAGNOSIS — Z1211 Encounter for screening for malignant neoplasm of colon: Secondary | ICD-10-CM

## 2020-06-17 MED ORDER — NA SULFATE-K SULFATE-MG SULF 17.5-3.13-1.6 GM/177ML PO SOLN: 1.0000 | Freq: Once | ORAL | 0 refills | Status: AC

## 2020-06-17 NOTE — Progress Notes (Signed)
Gastroenterology Pre-Procedure Review  Request Date: 07/15/20 Requesting Physician: Dr. Vicente Males  PATIENT REVIEW QUESTIONS: The patient responded to the following health history questions as indicated:    1. Are you having any GI issues? no 2. Do you have a personal history of Polyps? no 3. Do you have a family history of Colon Cancer or Polyps?no 4. Diabetes Mellitus? no 5. Joint replacements in the past 12 months?no 6. Major health problems in the past 3 months?no 7. Any artificial heart valves, MVP, or defibrillator?    MEDICATIONS & ALLERGIES:    Patient reports the following regarding taking any anticoagulation/antiplatelet therapy:   Plavix, Coumadin, Eliquis, Xarelto, Lovenox, Pradaxa, Brilinta, or Effient?  Aspirin? Yes 81mg   Patient confirms/reports the following medications:  Current Outpatient Medications  Medication Sig Dispense Refill  . aspirin EC 81 MG tablet Take 81 mg by mouth daily.    . pravastatin (PRAVACHOL) 40 MG tablet TAKE 1 TABLET BY MOUTH EVERY DAY 30 tablet 12   No current facility-administered medications for this visit.    Patient confirms/reports the following allergies:  Allergies  Allergen Reactions  . Lovastatin     No orders of the defined types were placed in this encounter.   AUTHORIZATION INFORMATION Primary Insurance: 1D#: Group #:  Secondary Insurance: 1D#: Group #:  SCHEDULE INFORMATION: Date: 07/15/20 Time: Location:[facility]

## 2020-07-13 ENCOUNTER — Other Ambulatory Visit
Admission: RE | Admit: 2020-07-13 | Discharge: 2020-07-13 | Disposition: A | Discharge status: Home / Self Care | Payer: PRIVATE HEALTH INSURANCE | Source: Ambulatory Visit | Attending: Gastroenterology | Admitting: Gastroenterology

## 2020-07-13 ENCOUNTER — Other Ambulatory Visit: Payer: Self-pay

## 2020-07-13 DIAGNOSIS — Z01812 Encounter for preprocedural laboratory examination: Secondary | ICD-10-CM | POA: Diagnosis not present

## 2020-07-13 DIAGNOSIS — Z20822 Contact with and (suspected) exposure to covid-19: Secondary | ICD-10-CM | POA: Insufficient documentation

## 2020-07-13 LAB — SARS CORONAVIRUS 2 (TAT 6-24 HRS): SARS Coronavirus 2: NEGATIVE

## 2020-07-14 ENCOUNTER — Other Ambulatory Visit: Payer: Self-pay

## 2020-07-14 ENCOUNTER — Telehealth: Payer: Self-pay | Admitting: Family Medicine

## 2020-07-14 ENCOUNTER — Telehealth: Payer: Self-pay

## 2020-07-14 ENCOUNTER — Encounter: Payer: Self-pay | Admitting: Gastroenterology

## 2020-07-14 MED ORDER — NA SULFATE-K SULFATE-MG SULF 17.5-3.13-1.6 GM/177ML PO SOLN: 1.0000 | Freq: Once | ORAL | 0 refills | Status: AC

## 2020-07-14 NOTE — Telephone Encounter (Signed)
Pt lvm that the pharmacy did not have his bowel prep.  [facility]  Then followed up to make sure they had it in stock.  They did not have this in stock.  Nulytely bowel prep was sent to pharmacy instructions provided and pharmacist will call pt when rx is ready.  Thanks,  [place], [place]

## 2020-07-14 NOTE — Telephone Encounter (Signed)
Pts wife called and is requesting to have the drink sent in for his colonoscopy that is scheduled for tomorrow morning (07/15/20) at 8:45. Please advise.       [id number] [address], [place] [address]  Phone: [phone] Fax: [phone]  Hours: Not open 24 hours

## 2020-07-14 NOTE — Telephone Encounter (Signed)
Advised that this comes from the GI office

## 2020-07-15 ENCOUNTER — Encounter
Admission: RE | Disposition: A | Discharge status: Home / Self Care | Payer: Self-pay | Source: Home / Self Care | Attending: Gastroenterology

## 2020-07-15 ENCOUNTER — Encounter: Payer: Self-pay | Admitting: Gastroenterology

## 2020-07-15 ENCOUNTER — Other Ambulatory Visit: Payer: Self-pay

## 2020-07-15 ENCOUNTER — Ambulatory Visit: Payer: PRIVATE HEALTH INSURANCE | Admitting: Registered Nurse

## 2020-07-15 ENCOUNTER — Ambulatory Visit
Admission: RE | Admit: 2020-07-15 | Discharge: 2020-07-15 | Disposition: A | Discharge status: Home / Self Care | Payer: PRIVATE HEALTH INSURANCE | Attending: Gastroenterology | Admitting: Gastroenterology

## 2020-07-15 DIAGNOSIS — Z1211 Encounter for screening for malignant neoplasm of colon: Secondary | ICD-10-CM | POA: Diagnosis not present

## 2020-07-15 DIAGNOSIS — Z888 Allergy status to other drugs, medicaments and biological substances status: Secondary | ICD-10-CM | POA: Insufficient documentation

## 2020-07-15 DIAGNOSIS — K635 Polyp of colon: Secondary | ICD-10-CM | POA: Diagnosis not present

## 2020-07-15 DIAGNOSIS — D120 Benign neoplasm of cecum: Secondary | ICD-10-CM | POA: Insufficient documentation

## 2020-07-15 DIAGNOSIS — Z7982 Long term (current) use of aspirin: Secondary | ICD-10-CM | POA: Diagnosis not present

## 2020-07-15 DIAGNOSIS — K640 First degree hemorrhoids: Secondary | ICD-10-CM | POA: Insufficient documentation

## 2020-07-15 DIAGNOSIS — Z79899 Other long term (current) drug therapy: Secondary | ICD-10-CM | POA: Insufficient documentation

## 2020-07-15 DIAGNOSIS — Z87891 Personal history of nicotine dependence: Secondary | ICD-10-CM | POA: Diagnosis not present

## 2020-07-15 DIAGNOSIS — K5730 Diverticulosis of large intestine without perforation or abscess without bleeding: Secondary | ICD-10-CM | POA: Insufficient documentation

## 2020-07-15 HISTORY — PX: COLONOSCOPY WITH PROPOFOL: SHX5780

## 2020-07-15 SURGERY — COLONOSCOPY WITH PROPOFOL
Anesthesia: General

## 2020-07-15 MED ORDER — SODIUM CHLORIDE 0.9 % IV SOLN: INTRAVENOUS | Status: DC

## 2020-07-15 MED ORDER — PROPOFOL 500 MG/50ML IV EMUL
INTRAVENOUS | Status: AC
  Filled 2020-07-15: qty 50

## 2020-07-15 MED ORDER — LIDOCAINE HCL (PF) 2 % IJ SOLN
INTRAMUSCULAR | Status: AC
  Filled 2020-07-15: qty 5

## 2020-07-15 MED ORDER — LIDOCAINE HCL (CARDIAC) PF 100 MG/5ML IV SOSY
PREFILLED_SYRINGE | INTRAVENOUS | Status: DC | PRN
  Administered 2020-07-15: 40 mg via INTRAVENOUS

## 2020-07-15 MED ORDER — DEXMEDETOMIDINE (PRECEDEX) IN NS 20 MCG/5ML (4 MCG/ML) IV SYRINGE
PREFILLED_SYRINGE | INTRAVENOUS | Status: AC
  Filled 2020-07-15: qty 5

## 2020-07-15 MED ORDER — PROPOFOL 10 MG/ML IV BOLUS
INTRAVENOUS | Status: DC | PRN
  Administered 2020-07-15: 100 mg via INTRAVENOUS

## 2020-07-15 MED ORDER — PROPOFOL 500 MG/50ML IV EMUL
INTRAVENOUS | Status: DC | PRN
  Administered 2020-07-15: 150 ug/kg/min via INTRAVENOUS

## 2020-07-15 NOTE — Anesthesia Preprocedure Evaluation (Signed)
Anesthesia Evaluation  Patient identified by MRN, date of birth, ID band Patient awake    Reviewed: Allergy & Precautions, H&P , NPO status , Patient's Chart, lab work & pertinent test results, reviewed documented beta blocker date and time   Airway Mallampati: II   Neck ROM: full    Dental  (+) Poor Dentition   Pulmonary neg pulmonary ROS, former smoker,    Pulmonary exam normal        Cardiovascular Exercise Tolerance: Good negative cardio ROS Normal cardiovascular exam Rhythm:regular Rate:Normal     Neuro/Psych negative neurological ROS  negative psych ROS   GI/Hepatic negative GI ROS, Neg liver ROS,   Endo/Other  negative endocrine ROS  Renal/GU Renal disease  negative genitourinary   Musculoskeletal   Abdominal   Peds  Hematology negative hematology ROS (+)   Anesthesia Other Findings Past Medical History: No date: Kidney stones Past Surgical History: No date: BICEPS TENDON REPAIR 2005: HERNIA REPAIR     Comment:  Umbilical hernia repair. Surgeon was Dr. Jamal Collin BMI    Body Mass Index: 35.87 kg/m     Reproductive/Obstetrics negative OB ROS                             Anesthesia Physical Anesthesia Plan  ASA: II  Anesthesia Plan: General   Post-op Pain Management:    Induction:   PONV Risk Score and Plan:   Airway Management Planned:   Additional Equipment:   Intra-op Plan:   Post-operative Plan:   Informed Consent: I have reviewed the patients History and Physical, chart, labs and discussed the procedure including the risks, benefits and alternatives for the proposed anesthesia with the patient or authorized representative who has indicated his/her understanding and acceptance.     Dental Advisory Given  Plan Discussed with: CRNA  Anesthesia Plan Comments:         Anesthesia Quick Evaluation

## 2020-07-15 NOTE — Op Note (Signed)
[facility] Patient Name: Bruce White Procedure Date: 07/15/2020 9:49 AM MRN: [id number] Account #: [id number] Date of Birth: 11-24-1955 Admit Type: Ambulatory Age: 65 Room: [facility] ENDO ROOM 4 Gender: Male Note Status: Finalized Procedure:             Colonoscopy Indications:           Screening for colorectal malignant neoplasm Providers:             Jonathon Bellows MD, MD Medicines:             Monitored Anesthesia Care Complications:         No immediate complications. Procedure:             Pre-Anesthesia Assessment:                        - Prior to the procedure, a History and Physical was                         performed, and patient medications, allergies and                         sensitivities were reviewed. The patient's tolerance                         of previous anesthesia was reviewed.                        - The risks and benefits of the procedure and the                         sedation options and risks were discussed with the                         patient. All questions were answered and informed                         consent was obtained.                        - ASA Grade Assessment: II - A patient with mild                         systemic disease.                        After obtaining informed consent, the colonoscope was                         passed under direct vision. Throughout the procedure,                         the patient's blood pressure, pulse, and oxygen                         saturations were monitored continuously. The                         Colonoscope was introduced through the anus and  advanced to the the cecum, identified by the                         appendiceal orifice. The colonoscopy was performed                         without difficulty. The patient tolerated the                         procedure well. The quality of the bowel preparation                          was excellent. Findings:      The perianal and digital rectal examinations were normal.      A 10 mm polyp was found in the cecum. The polyp was sessile. The polyp       was removed with a cold snare. Resection and retrieval were complete.      Multiple small and large-mouthed diverticula were found in the sigmoid       colon and ascending colon.      Non-bleeding internal hemorrhoids were found during retroflexion. The       hemorrhoids were large and Grade I (internal hemorrhoids that do not       prolapse).      The exam was otherwise without abnormality on direct and retroflexion       views. Impression:            - One 10 mm polyp in the cecum, removed with a cold                         snare. Resected and retrieved.                        - Diverticulosis in the sigmoid colon and in the                         ascending colon.                        - Non-bleeding internal hemorrhoids.                        - The examination was otherwise normal on direct and                         retroflexion views. Recommendation:        - Discharge patient to home (with escort).                        - Resume previous diet.                        - Continue present medications.                        - Await pathology results.                        - Repeat colonoscopy for surveillance based on  pathology results. Procedure Code(s):     --- Professional ---                        [phone], Colonoscopy, flexible; with removal of                         tumor(s), polyp(s), or other lesion(s) by snare                         technique Diagnosis Code(s):     --- Professional ---                        Z12.11, Encounter for screening for malignant neoplasm                         of colon                        K63.5, Polyp of colon                        K64.0, First degree hemorrhoids                        K57.30, Diverticulosis of large intestine without                          perforation or abscess without bleeding CPT copyright 2019 American Medical Association. All rights reserved. The codes documented in this report are preliminary and upon coder review may  be revised to meet current compliance requirements. Jonathon Bellows, MD Jonathon Bellows MD, MD 07/15/2020 10:14:38 AM This report has been signed electronically. Number of Addenda: 0 Note Initiated On: 07/15/2020 9:49 AM Scope Withdrawal Time: 0 hours 13 minutes 7 seconds  Total Procedure Duration: 0 hours 16 minutes 44 seconds  Estimated Blood Loss:  Estimated blood loss: none.      [facility]

## 2020-07-15 NOTE — H&P (Signed)
Jonathon Bellows, MD [address] [place], [place], [place], [address] [address], [place], [place], [place], [address] Phone: [phone]  Fax: [phone]  Primary Care Physician:  Birdie Sons, MD   Pre-Procedure History & Physical: HPI:  Bruce White is a 65 y.o. male is here for an colonoscopy.   Past Medical History:  Diagnosis Date  . Kidney stones     Past Surgical History:  Procedure Laterality Date  . BICEPS TENDON REPAIR    . HERNIA REPAIR  3267   Umbilical hernia repair. Surgeon was Dr. Jamal Collin    Prior to Admission medications   Medication Sig Start Date End Date Taking? Authorizing Provider  pravastatin (PRAVACHOL) 40 MG tablet TAKE 1 TABLET BY MOUTH EVERY DAY 03/24/19  Yes Birdie Sons, MD  aspirin EC 81 MG tablet Take 81 mg by mouth daily.    [provider]    Allergies as of 06/17/2020 - Review Complete 06/17/2020  Allergen Reaction Noted  . Lovastatin  06/10/2015    Family History  Problem Relation Age of Onset  . Hypertension Father   . CAD Father   . Dementia Father   . Stroke Father   . Parkinson's disease Father   . Heart attack Brother   . Heart disease Other     Social History   Socioeconomic History  . Marital status: Married    Spouse name: Not on file  . Number of children: 2  . Years of education: Not on file  . Highest education level: Not on file  Occupational History  . Occupation: [employer]  Tobacco Use  . Smoking status: Former Smoker    Packs/day: 2.00    Years: 30.00    Pack years: 60.00    Types: Cigarettes    Quit date: 05/29/2003    Years since quitting: 17.1  . Smokeless tobacco: Never Used  Substance and Sexual Activity  . Alcohol use: Yes    Alcohol/week: 0.0 standard drinks    Comment: occasional use  . Drug use: No  . Sexual activity: Not on file  Other Topics Concern  . Not on file  Social History Narrative  . Not on file   Social Determinants of Health   Financial  Resource Strain: Not on file  Food Insecurity: Not on file  Transportation Needs: Not on file  Physical Activity: Not on file  Stress: Not on file  Social Connections: Not on file  Intimate Partner Violence: Not on file    Review of Systems: See HPI, otherwise negative ROS  Physical Exam: BP (!) 136/98   Pulse (!) 58   Temp (!) 97.3 F (36.3 C) (Temporal)   Resp 18   Ht 5\' 10"  (1.778 m)   Wt 113.4 kg   SpO2 98%   BMI 35.87 kg/m  General:   Alert,  pleasant and cooperative in NAD Head:  Normocephalic and atraumatic. Neck:  Supple; no masses or thyromegaly. Lungs:  Clear throughout to auscultation, normal respiratory effort.    Heart:  +S1, +S2, Regular rate and rhythm, No edema. Abdomen:  Soft, nontender and nondistended. Normal bowel sounds, without guarding, and without rebound.   Neurologic:  Alert and  oriented x4;  grossly normal neurologically.  Impression/Plan: Bruce White is here for an colonoscopy to be performed for Screening colonoscopy average risk   Risks, benefits, limitations, and alternatives regarding  colonoscopy have been reviewed with the patient.  Questions have been answered.  All parties agreeable.   Jonathon Bellows, MD  07/15/2020, 9:41 AM

## 2020-07-15 NOTE — Transfer of Care (Signed)
Immediate Anesthesia Transfer of Care Note  Patient: Bruce White  Procedure(s) Performed: Procedure(s): COLONOSCOPY WITH PROPOFOL (N/A)  Patient Location: PACU and Endoscopy Unit  Anesthesia Type:General  Level of Consciousness: sedated  Airway & Oxygen Therapy: Patient Spontanous Breathing and Patient connected to nasal cannula oxygen  Post-op Assessment: Report given to RN and Post -op Vital signs reviewed and stable  Post vital signs: Reviewed and stable  Last Vitals:  Vitals:   07/15/20 0910 07/15/20 1013  BP: (!) 136/98 106/80  Pulse: (!) 58 (!) 58  Resp: 18 (!) 8  Temp: (!) 36.3 C (!) 36.2 C  SpO2: 42% 68%    Complications: No apparent anesthesia complications

## 2020-07-17 NOTE — Anesthesia Postprocedure Evaluation (Signed)
Anesthesia Post Note  Patient: Kathi Ludwig  Procedure(s) Performed: COLONOSCOPY WITH PROPOFOL (N/A )  Patient location during evaluation: PACU Anesthesia Type: General Level of consciousness: awake and alert Pain management: pain level controlled Vital Signs Assessment: post-procedure vital signs reviewed and stable Respiratory status: spontaneous breathing, nonlabored ventilation, respiratory function stable and patient connected to nasal cannula oxygen Cardiovascular status: blood pressure returned to baseline and stable Postop Assessment: no apparent nausea or vomiting Anesthetic complications: no   No complications documented.   Last Vitals:  Vitals:   07/15/20 0910 07/15/20 1013  BP: (!) 136/98 106/80  Pulse: (!) 58 (!) 58  Resp: 18 (!) 8  Temp: (!) 36.3 C (!) 36.2 C  SpO2: 98% 97%    Last Pain:  Vitals:   07/15/20 1033  TempSrc:   PainSc: 0-No pain                 Molli Barrows

## 2020-07-18 ENCOUNTER — Encounter: Payer: Self-pay | Admitting: Gastroenterology

## 2020-07-18 LAB — SURGICAL PATHOLOGY

## 2020-07-19 ENCOUNTER — Encounter: Payer: Self-pay | Admitting: Gastroenterology

## 2020-07-20 ENCOUNTER — Encounter: Payer: Self-pay | Admitting: Family Medicine

## 2020-07-20 DIAGNOSIS — Z86010 Personal history of colonic polyps: Secondary | ICD-10-CM | POA: Insufficient documentation

## 2021-08-21 ENCOUNTER — Encounter: Payer: Self-pay | Admitting: Family Medicine

## 2022-06-05 NOTE — Progress Notes (Unsigned)
I,Sulibeya S Dimas,acting as a scribe for Lelon Huh, MD.,have documented all relevant documentation on the behalf of Lelon Huh, MD,as directed by  Lelon Huh, MD while in the presence of Lelon Huh, MD.    Complete Physical Exam      Patient: Bruce White, Male    DOB: [birth year], 67 y.o.   MRN: [id number] Visit Date: 06/06/2022  Today's Provider: Lelon Huh, MD    Subjective    Bruce White is a 66 y.o. male who presents today for his complete physical examination. He reports consuming a general diet. Home exercise routine includes walking. He generally feels well. He reports sleeping well. He does not have additional problems to discuss today.   He no longer takes statin for his cholesterol, but does try to follow up low low sugar fat diet.   Medications: Outpatient Medications Prior to Visit  Medication Sig   aspirin EC 81 MG tablet Take 81 mg by mouth daily.   [DISCONTINUED] pravastatin (PRAVACHOL) 40 MG tablet TAKE 1 TABLET BY MOUTH EVERY DAY   No facility-administered medications prior to visit.    Allergies  Allergen Reactions   Lovastatin     Patient Care Team: Birdie Sons, MD as PCP - General (Family Medicine)  Review of Systems  Constitutional:  Negative for chills, diaphoresis and fever.  HENT:  Negative for congestion, ear discharge, ear pain, hearing loss, nosebleeds, sore throat and tinnitus.   Eyes:  Negative for photophobia, pain, discharge and redness.  Respiratory:  Negative for cough, shortness of breath, wheezing and stridor.   Cardiovascular:  Negative for chest pain, palpitations and leg swelling.  Gastrointestinal:  Negative for abdominal pain, blood in stool, constipation, diarrhea, nausea and vomiting.  Endocrine: Negative for polydipsia.  Genitourinary:  Negative for dysuria, flank pain, frequency, hematuria and urgency.  Musculoskeletal:  Negative for back pain, myalgias and neck pain.  Skin:  Negative for  rash.  Allergic/Immunologic: Negative for environmental allergies.  Neurological:  Negative for dizziness, tremors, seizures, weakness and headaches.  Hematological:  Does not bruise/bleed easily.  Psychiatric/Behavioral:  Negative for hallucinations and suicidal ideas. The patient is not nervous/anxious.     Last metabolic panel Lab Results  Component Value Date   GLUCOSE 87 05/31/2020   NA 138 05/31/2020   K 4.7 05/31/2020   CL 102 05/31/2020   CO2 23 05/31/2020   BUN 12 05/31/2020   CREATININE 1.31 (H) 05/31/2020   GFRNONAA 57 (L) 05/31/2020   CALCIUM 9.5 05/31/2020   PROT 7.4 05/31/2020   ALBUMIN 4.4 05/31/2020   LABGLOB 3.0 05/31/2020   AGRATIO 1.5 05/31/2020   BILITOT 0.5 05/31/2020   ALKPHOS 94 05/31/2020   AST 30 05/31/2020   ALT 20 05/31/2020   Last lipids Lab Results  Component Value Date   CHOL 201 (H) 05/31/2020   HDL 39 (L) 05/31/2020   LDLCALC 108 (H) 05/31/2020   TRIG 317 (H) 05/31/2020   CHOLHDL 5.2 (H) 05/31/2020   Last thyroid functions Lab Results  Component Value Date   TSH 2.530 06/14/2015    Objective    Vitals: BP 131/84 (BP Location: Left Arm, Patient Position: Sitting, Cuff Size: Large)   Pulse 88   Temp 97.9 F (36.6 C) (Temporal)   Resp 16   Ht '5\' 10"'$  (1.778 m)   Wt 243 lb 14.4 oz (110.6 kg)   BMI 35.00 kg/m  BP Readings from Last 3 Encounters:  06/06/22 131/84  07/15/20 106/80  05/31/20 125/82  Wt Readings from Last 3 Encounters:  06/06/22 243 lb 14.4 oz (110.6 kg)  07/15/20 250 lb (113.4 kg)  05/31/20 255 lb 9.6 oz (115.9 kg)    Physical Exam  General Appearance:    Mildly obese male. Alert, cooperative, in no acute distress, appears stated age  Head:    Normocephalic, without obvious abnormality, atraumatic  Eyes:    PERRL, conjunctiva/corneas clear, EOM's intact, fundi    benign, both eyes       Ears:    Normal TM's and external ear canals, both ears  Nose:   Nares normal, septum midline, mucosa normal, no  drainage   or sinus tenderness  Throat:   Lips, mucosa, and tongue normal; teeth and gums normal  Neck:   Supple, symmetrical, trachea midline, no adenopathy;       thyroid:  No enlargement/tenderness/nodules; no carotid   bruit or JVD  Back:     Symmetric, no curvature, ROM normal, no CVA tenderness  Lungs:     Clear to auscultation bilaterally, respirations unlabored  Chest wall:    No tenderness or deformity  Heart:    Normal heart rate. Normal rhythm. No murmurs, rubs, or gallops.  S1 and S2 normal  Abdomen:     Soft, non-tender, bowel sounds active all four quadrants,    no masses, no organomegaly  Genitalia:    deferred  Rectal:    deferred  Extremities:   All extremities are intact. No cyanosis or edema  Pulses:   2+ and symmetric all extremities  Skin:   Skin color, texture, turgor normal, no rashes or lesions  Lymph nodes:   Cervical, supraclavicular, and axillary nodes normal  Neurologic:   CNII-XII intact. Normal strength, sensation and reflexes      throughout     Most recent functional status assessment:     No data to display         Most recent fall risk assessment:    05/31/2020    2:56 PM  [place] in the past year? 0  Number falls in past yr: 0  Injury with Fall? 0  Follow up Falls evaluation completed    Most recent depression screenings:    05/31/2020    2:56 PM 03/13/2019   10:14 AM  PHQ 2/9 Scores  PHQ - 2 Score 0 0  PHQ- 9 Score 0 0   Most recent cognitive screening:     No data to display         Most recent Audit-C alcohol use screening    05/31/2020    2:56 PM  Alcohol Use Disorder Test (AUDIT)  1. How often do you have a drink containing alcohol? 1  2. How many drinks containing alcohol do you have on a typical day when you are drinking? 0  3. How often do you have six or more drinks on one occasion? 0  AUDIT-C Score 1  Alcohol Brief Interventions/Follow-up AUDIT Score <7 follow-up not indicated   A score of 3 or more in  women, and 4 or more in men indicates increased risk for alcohol abuse, EXCEPT if all of the points are from question 1     Assessment & Plan    1. Annual physical exam  - EKG 12-Lead  2. Hyperlipidemia, mixed No longer taking pravastatin as he prefers to manage with dietary changes.   - CBC - Comprehensive metabolic panel - Lipid panel - EKG 12-Lead  3. History of adenomatous  polyp of colon UTD on follow up screenings.    4. Prostate cancer screening  - PSA Total (Reflex To Free) ([facility] only)  5. Screening-pulmonary TB For wrk - QuantiFERON-TB Gold Plus  6. Need for vaccination against Streptococcus pneumoniae  - Pneumococcal conjugate vaccine 20-valent (PCV20)         Lelon Huh, MD  [facility] [phone] (phone) [phone] (fax)  [place]

## 2022-06-06 ENCOUNTER — Encounter: Payer: Self-pay | Admitting: Family Medicine

## 2022-06-06 ENCOUNTER — Ambulatory Visit (INDEPENDENT_AMBULATORY_CARE_PROVIDER_SITE_OTHER): Payer: BC Managed Care – PPO | Admitting: Family Medicine

## 2022-06-06 VITALS — BP 131/84 | HR 88 | Temp 97.9°F | Resp 16 | Ht 70.0 in | Wt 243.9 lb

## 2022-06-06 DIAGNOSIS — Z23 Encounter for immunization: Secondary | ICD-10-CM

## 2022-06-06 DIAGNOSIS — Z86010 Personal history of colonic polyps: Secondary | ICD-10-CM | POA: Diagnosis not present

## 2022-06-06 DIAGNOSIS — E782 Mixed hyperlipidemia: Secondary | ICD-10-CM

## 2022-06-06 DIAGNOSIS — Z0000 Encounter for general adult medical examination without abnormal findings: Secondary | ICD-10-CM

## 2022-06-06 DIAGNOSIS — Z111 Encounter for screening for respiratory tuberculosis: Secondary | ICD-10-CM

## 2022-06-06 DIAGNOSIS — Z125 Encounter for screening for malignant neoplasm of prostate: Secondary | ICD-10-CM | POA: Diagnosis not present

## 2022-06-06 NOTE — Patient Instructions (Signed)
.   Please review the attached list of medications and notify my office if there are any errors.   . Please bring all of your medications to every appointment so we can make sure that our medication list is the same as yours.   

## 2022-06-10 LAB — COMPREHENSIVE METABOLIC PANEL
ALT: 20 IU/L (ref 0–44)
AST: 23 IU/L (ref 0–40)
Albumin/Globulin Ratio: 1.5 (ref 1.2–2.2)
Albumin: 4.4 g/dL (ref 3.9–4.9)
Alkaline Phosphatase: 105 IU/L (ref 44–121)
BUN/Creatinine Ratio: 8 — ABNORMAL LOW (ref 10–24)
BUN: 12 mg/dL (ref 8–27)
Bilirubin Total: 0.8 mg/dL (ref 0.0–1.2)
CO2: 23 mmol/L (ref 20–29)
Calcium: 9.5 mg/dL (ref 8.6–10.2)
Chloride: 103 mmol/L (ref 96–106)
Creatinine, Ser: 1.46 mg/dL — ABNORMAL HIGH (ref 0.76–1.27)
Globulin, Total: 2.9 g/dL (ref 1.5–4.5)
Glucose: 98 mg/dL (ref 70–99)
Potassium: 4.9 mmol/L (ref 3.5–5.2)
Sodium: 142 mmol/L (ref 134–144)
Total Protein: 7.3 g/dL (ref 6.0–8.5)
eGFR: 52 mL/min/{1.73_m2} — ABNORMAL LOW (ref >59)

## 2022-06-10 LAB — CBC
Hematocrit: 51 % (ref 37.5–51.0)
Hemoglobin: 17.6 g/dL (ref 13.0–17.7)
MCH: 30.1 pg (ref 26.6–33.0)
MCHC: 34.5 g/dL (ref 31.5–35.7)
MCV: 87 fL (ref 79–97)
Platelets: 277 10*3/uL (ref 150–450)
RBC: 5.85 x10E6/uL — ABNORMAL HIGH (ref 4.14–5.80)
RDW: 12.7 % (ref 11.6–15.4)
WBC: 7.6 10*3/uL (ref 3.4–10.8)

## 2022-06-10 LAB — LIPID PANEL
Chol/HDL Ratio: 5 ratio (ref 0.0–5.0)
Cholesterol, Total: 245 mg/dL — ABNORMAL HIGH (ref 100–199)
HDL: 49 mg/dL (ref >39)
LDL Chol Calc (NIH): 178 mg/dL — ABNORMAL HIGH (ref 0–99)
Triglycerides: 104 mg/dL (ref 0–149)
VLDL Cholesterol Cal: 18 mg/dL (ref 5–40)

## 2022-06-10 LAB — QUANTIFERON-TB GOLD PLUS
QuantiFERON Mitogen Value: >10 IU/mL
QuantiFERON Nil Value: 0 IU/mL
QuantiFERON TB1 Ag Value: 0 IU/mL
QuantiFERON TB2 Ag Value: 0 IU/mL
QuantiFERON-TB Gold Plus: NEGATIVE

## 2022-06-10 LAB — PSA TOTAL (REFLEX TO FREE): Prostate Specific Ag, Serum: 0.8 ng/mL (ref 0.0–4.0)

## 2022-06-21 ENCOUNTER — Other Ambulatory Visit: Payer: Self-pay | Admitting: Family Medicine

## 2022-06-21 DIAGNOSIS — E782 Mixed hyperlipidemia: Secondary | ICD-10-CM

## 2022-06-21 MED ORDER — PRAVASTATIN SODIUM 40 MG PO TABS: 40.0000 mg | ORAL_TABLET | Freq: Every day | ORAL | 2 refills | Status: DC

## 2022-09-19 ENCOUNTER — Telehealth: Payer: Self-pay

## 2022-09-19 NOTE — Telephone Encounter (Signed)
Called patient to advise form has been completed and ready for him to pick up.

## 2023-09-27 ENCOUNTER — Encounter: Payer: PRIVATE HEALTH INSURANCE | Admitting: Family Medicine

## 2023-10-02 ENCOUNTER — Ambulatory Visit: Payer: PRIVATE HEALTH INSURANCE | Admitting: Family Medicine

## 2023-10-02 ENCOUNTER — Encounter: Payer: Self-pay | Admitting: Family Medicine

## 2023-10-02 VITALS — BP 123/85 | HR 64 | Ht 70.0 in | Wt 244.3 lb

## 2023-10-02 DIAGNOSIS — Z860101 Personal history of adenomatous and serrated colon polyps: Secondary | ICD-10-CM

## 2023-10-02 DIAGNOSIS — Z0000 Encounter for general adult medical examination without abnormal findings: Secondary | ICD-10-CM

## 2023-10-02 DIAGNOSIS — Z125 Encounter for screening for malignant neoplasm of prostate: Secondary | ICD-10-CM

## 2023-10-02 DIAGNOSIS — E782 Mixed hyperlipidemia: Secondary | ICD-10-CM

## 2023-10-02 DIAGNOSIS — Z111 Encounter for screening for respiratory tuberculosis: Secondary | ICD-10-CM | POA: Diagnosis not present

## 2023-10-02 DIAGNOSIS — Z0001 Encounter for general adult medical examination with abnormal findings: Secondary | ICD-10-CM | POA: Diagnosis not present

## 2023-10-02 DIAGNOSIS — Z23 Encounter for immunization: Secondary | ICD-10-CM

## 2023-10-02 NOTE — Progress Notes (Signed)
 Complete physical exam   Patient: Bruce White   DOB: [birth year]   68 y.o. Male  MRN: [id number] Visit Date: 10/02/2023  Today's healthcare provider: Jeralene Mom, MD   Chief Complaint  Patient presents with   Annual Exam    Wants to dicuss if colonoscopy and shingles necessary   Needs TB test for work   Immunizations   Hyperlipidemia   Subjective    Discussed the use of AI scribe software for clinical note transcription with the patient, who gave verbal consent to proceed.  History of Present Illness   Bruce White "Dorrene Gaucher" is a 68 year old male who presents for an annual physical exam.  He has a history of adverse reactions to statin medications, specifically fluvastatin and pravastatin , which caused stomach discomfort and nausea, leading to discontinuation of these medications.  He maintains a fair diet, including fruit and avoiding breads, but still consumes potatoes. He has a family history of heart disease, with his grandfather having died of a heart attack. His father had dementia, but there are no heart attacks in recent generations. He has one younger brother who is reportedly healthy.  He had a colonoscopy in February 2022, during which a 10 mm serrated polyp was removed from the cecum. Dr. Antony Baumgartner recommended 3 year follow up.   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 No significant joint issues, though he had a Baker's cyst in the past. Occasionally, he experiences tingling in his arm, which he attributes to a previous injury.  No hearing problems, tinnitus, or changes in bowel habits. He has an eye doctor and reports having excellent vision.  He is planning to receive the shingles vaccine and is due for a TB test for work purposes.      Lab Results  Component Value Date   CHOL 245 (H) 06/06/2022   HDL 49 06/06/2022    LDLCALC 178 (H) 06/06/2022   TRIG 104 06/06/2022   CHOLHDL 5.0 06/06/2022        Past Medical History:  Diagnosis Date   Kidney stones    Past Surgical History:  Procedure Laterality Date   BICEPS TENDON REPAIR     COLONOSCOPY WITH PROPOFOL  N/A 07/15/2020   Procedure: COLONOSCOPY WITH PROPOFOL ;  Surgeon: Luke Salaam, MD;  Location: [facility]COPY;  Service: Gastroenterology;  Laterality: N/A;   HERNIA REPAIR  2005   Umbilical hernia repair. Surgeon was Dr. Lorel Roes   Social History   Socioeconomic History   Marital status: Married    Spouse name: Not on file   Number of children: 2   Years of education: Not on file   Highest education level: Not on file  Occupational History   Occupation: [employer]  Tobacco Use   Smoking status: Former    Current packs/day: 0.00    Average packs/day: 2.0 packs/day for 30.0 years (60.0 ttl pk-yrs)    Types: Cigarettes, Cigars    Start date: 05/28/1973    Quit date: 05/29/2003    Years since quitting: 20.3   Smokeless tobacco: Never   Tobacco comments:    Has 1 cigar occasionally   Substance and Sexual Activity   Alcohol use: Yes    Alcohol/week: 0.0 standard drinks of alcohol    Comment: occasional use   Drug use: No   Sexual activity: Yes  Birth control/protection: None  Other Topics Concern   Not on file  Social History Narrative   Not on file   Social Drivers of Health   Financial Resource Strain: Low Risk  (10/02/2023)   Overall Financial Resource Strain (CARDIA)    Difficulty of Paying Living Expenses: Not hard at all  Food Insecurity: No Food Insecurity (10/02/2023)   Hunger Vital Sign    Worried About Running Out of Food in the Last Year: Never true    Ran Out of Food in the Last Year: Never true  Transportation Needs: No Transportation Needs (10/02/2023)   PRAPARE - Administrator, Civil Service (Medical): No    Lack of Transportation (Non-Medical): No  Physical Activity: Sufficiently Active (10/02/2023)    Exercise Vital Sign    Days of Exercise per Week: 3 days    Minutes of Exercise per Session: 50 min  Stress: No Stress Concern Present (10/02/2023)   [employer] of Occupational Health - Occupational Stress Questionnaire    Feeling of Stress : Not at all  Social Connections: Moderately Integrated (10/02/2023)   Social Connection and Isolation Panel [NHANES]    Frequency of Communication with Friends and Family: More than three times a week    Frequency of Social Gatherings with Friends and Family: More than three times a week    Attends Religious Services: Never    Database administrator or Organizations: Yes    Attends Engineer, structural: More than 4 times per year    Marital Status: Married  Catering manager Violence: Not At Risk (10/02/2023)   Humiliation, Afraid, Rape, and Kick questionnaire    Fear of Current or Ex-Partner: No    Emotionally Abused: No    Physically Abused: No    Sexually Abused: No   Family Status  Relation Name Status   Mother Rivers Hildebran Deceased   Father  Deceased   Brother  Alive   Other grandfather Alive  No partnership data on file   Family History  Problem Relation Age of Onset   Hypertension Father    CAD Father    Dementia Father    Stroke Father    Parkinson's disease Father    Heart attack Brother    Heart disease Other    Allergies  Allergen Reactions   Lovastatin     Patient Care Team: Lamon Pillow, MD as PCP - General (Family Medicine) Luke Salaam, MD as Consulting Physician (Gastroenterology)   Medications: Outpatient Medications Prior to Visit  Medication Sig   aspirin EC 81 MG tablet Take 81 mg by mouth every 3 (three) days.   pravastatin  (PRAVACHOL ) 40 MG tablet Take 1 tablet (40 mg total) by mouth daily. (Patient not taking: Reported on 10/02/2023)   No facility-administered medications prior to visit.    Review of Systems  Constitutional:  Negative for chills, diaphoresis and fever.  HENT:  Negative  for congestion, ear discharge, ear pain, hearing loss, nosebleeds, sore throat and tinnitus.   Eyes:  Negative for photophobia, pain, discharge and redness.  Respiratory:  Negative for cough, shortness of breath, wheezing and stridor.   Cardiovascular:  Negative for chest pain, palpitations and leg swelling.  Gastrointestinal:  Negative for abdominal pain, blood in stool, constipation, diarrhea, nausea and vomiting.  Endocrine: Negative for polydipsia.  Genitourinary:  Negative for dysuria, flank pain, frequency, hematuria and urgency.  Musculoskeletal:  Negative for back pain, myalgias and neck pain.  Skin:  Negative for rash.  Allergic/Immunologic: Negative for environmental allergies.  Neurological:  Negative for dizziness, tremors, seizures, weakness and headaches.  Hematological:  Does not bruise/bleed easily.  Psychiatric/Behavioral:  Negative for hallucinations and suicidal ideas. The patient is not nervous/anxious.       Objective    BP 123/85 (BP Location: Left Arm, Patient Position: Standing;Sitting, Cuff Size: Normal)   Pulse 64   Ht 5\' 10"  (1.778 m)   Wt 244 lb 4.8 oz (110.8 kg)   SpO2 100%   BMI 35.05 kg/m    Physical Exam   General Appearance:    Mildly obese male. Alert, cooperative, in no acute distress, appears stated age  Head:    Normocephalic, without obvious abnormality, atraumatic  Eyes:    PERRL, conjunctiva/corneas clear, EOM's intact, fundi    benign, both eyes       Ears:    Normal TM's and external ear canals, both ears  Nose:   Nares normal, septum midline, mucosa normal, no drainage   or sinus tenderness  Throat:   Lips, mucosa, and tongue normal; teeth and gums normal  Neck:   Supple, symmetrical, trachea midline, no adenopathy;       thyroid:  No enlargement/tenderness/nodules; no carotid   bruit or JVD  Back:     Symmetric, no curvature, ROM normal, no CVA tenderness  Lungs:     Clear to auscultation bilaterally, respirations unlabored  Chest  wall:    No tenderness or deformity  Heart:    Normal heart rate. Normal rhythm. 2/6 systolic murmur RUSB and LUSB. No rubs, or gallops.  S1 and S2 normal  Abdomen:     Soft, non-tender, bowel sounds active all four quadrants,    no masses, no organomegaly  Genitalia:    deferred  Rectal:    deferred  Extremities:   All extremities are intact. No cyanosis or edema  Pulses:   2+ and symmetric all extremities  Skin:   Skin color, texture, turgor normal, no rashes or lesions  Lymph nodes:   Cervical, supraclavicular, and axillary nodes normal  Neurologic:   CNII-XII intact. Normal strength, sensation and reflexes      throughout       Last depression screening scores    06/06/2022    9:06 AM 05/31/2020    2:56 PM 03/13/2019   10:14 AM  PHQ 2/9 Scores  PHQ - 2 Score 0 0 0  PHQ- 9 Score 0 0 0   Last fall risk screening    06/06/2022    9:05 AM  Fall Risk   Falls in the past year? 0  Number falls in past yr: 0  Injury with Fall? 0  Risk for fall due to : No Fall Risks  Follow up Falls evaluation completed   Last Audit-C alcohol use screening    06/06/2022    9:06 AM  Alcohol Use Disorder Test (AUDIT)  1. How often do you have a drink containing alcohol? 1  2. How many drinks containing alcohol do you have on a typical day when you are drinking? 0  3. How often do you have six or more drinks on one occasion? 0  AUDIT-C Score 1   A score of 3 or more in women, and 4 or more in men indicates increased risk for alcohol abuse, EXCEPT if all of the points are from question 1   No results found for any visits on 10/02/23.  Assessment & Plan    Routine Health Maintenance and Physical  Exam  Exercise Activities and Dietary recommendations  Goals   None     Immunization History  Administered Date(s) Administered   Influenza,inj,Quad PF,6+ Mos 03/26/2018, 03/13/2019   Influenza-Unspecified 03/12/2015, 02/25/2017   Moderna Sars-Covid-2 Vaccination 08/06/2019, 09/03/2019,  03/29/2020   PNEUMOCOCCAL CONJUGATE-20 06/06/2022   Tdap 05/01/2005, 03/13/2019   Zoster Recombinant(Shingrix) 10/02/2023   Zoster, Live 06/14/2015    Health Maintenance  Topic Date Due   COVID-19 Vaccine (4 - 2024-25 season) 01/27/2023   Colonoscopy  07/16/2023   Zoster Vaccines- Shingrix (2 of 2) 11/27/2023   INFLUENZA VACCINE  12/27/2023   DTaP/Tdap/Td (3 - Td or Tdap) 03/12/2029   Pneumonia Vaccine 84+ Years old  Completed   Hepatitis C Screening  Completed   HPV VACCINES  Aged Out   Meningococcal B Vaccine  Aged Out    Discussed health benefits of physical activity, and encouraged him to engage in regular exercise appropriate for his age and condition.      2. Hyperlipidemia, mixed Intolerant to pravastatin , fluvastatin, and lovastatin statin   - CBC - Comprehensive metabolic panel with GFR - Lipid panel - Apolipoprotein B - Lipoprotein A (LPA)  3. History of adenomatous polyp of colon  - Ambulatory referral to Gastroenterology  4. Prostate cancer screening  - PSA  5. Need for shingles vaccine  - Zoster Recombinant (Shingrix ) #1, Repeat after 2 months.   6. Screening-pulmonary TB  - QuantiFERON-TB Gold Plus        Jeralene Mom, MD  [facility] [phone] (phone) [phone] (fax)  [facility]

## 2023-10-05 LAB — LIPID PANEL
Chol/HDL Ratio: 5.2 ratio — ABNORMAL HIGH (ref 0.0–5.0)
Cholesterol, Total: 224 mg/dL — ABNORMAL HIGH (ref 100–199)
HDL: 43 mg/dL (ref >39)
LDL Chol Calc (NIH): 154 mg/dL — ABNORMAL HIGH (ref 0–99)
Triglycerides: 147 mg/dL (ref 0–149)
VLDL Cholesterol Cal: 27 mg/dL (ref 5–40)

## 2023-10-05 LAB — COMPREHENSIVE METABOLIC PANEL WITH GFR
ALT: 15 IU/L (ref 0–44)
AST: 19 IU/L (ref 0–40)
Albumin: 4.1 g/dL (ref 3.9–4.9)
Alkaline Phosphatase: 96 IU/L (ref 44–121)
BUN/Creatinine Ratio: 7 — ABNORMAL LOW (ref 10–24)
BUN: 11 mg/dL (ref 8–27)
Bilirubin Total: 0.8 mg/dL (ref 0.0–1.2)
CO2: 19 mmol/L — ABNORMAL LOW (ref 20–29)
Calcium: 9.5 mg/dL (ref 8.6–10.2)
Chloride: 103 mmol/L (ref 96–106)
Creatinine, Ser: 1.49 mg/dL — ABNORMAL HIGH (ref 0.76–1.27)
Globulin, Total: 3.3 g/dL (ref 1.5–4.5)
Glucose: 86 mg/dL (ref 70–99)
Potassium: 5.1 mmol/L (ref 3.5–5.2)
Sodium: 140 mmol/L (ref 134–144)
Total Protein: 7.4 g/dL (ref 6.0–8.5)
eGFR: 51 mL/min/{1.73_m2} — ABNORMAL LOW (ref >59)

## 2023-10-05 LAB — QUANTIFERON-TB GOLD PLUS
QuantiFERON Mitogen Value: 7.34 [IU]/mL
QuantiFERON Nil Value: 0.05 [IU]/mL
QuantiFERON TB1 Ag Value: 0.05 [IU]/mL
QuantiFERON TB2 Ag Value: 0.06 [IU]/mL

## 2023-10-05 LAB — CBC
Hematocrit: 52.2 % — ABNORMAL HIGH (ref 37.5–51.0)
Hemoglobin: 17.7 g/dL (ref 13.0–17.7)
MCH: 29.9 pg (ref 26.6–33.0)
MCHC: 33.9 g/dL (ref 31.5–35.7)
MCV: 88 fL (ref 79–97)
Platelets: 237 10*3/uL (ref 150–450)
RBC: 5.92 x10E6/uL — ABNORMAL HIGH (ref 4.14–5.80)
RDW: 12.8 % (ref 11.6–15.4)
WBC: 7.6 10*3/uL (ref 3.4–10.8)

## 2023-10-05 LAB — PSA: Prostate Specific Ag, Serum: 1.4 ng/mL (ref 0.0–4.0)

## 2023-10-05 LAB — LIPOPROTEIN A (LPA): Lipoprotein (a): 33.7 nmol/L (ref <75.0)

## 2023-10-05 LAB — APOLIPOPROTEIN B: Apolipoprotein B: 112 mg/dL — ABNORMAL HIGH (ref <90)

## 2023-10-07 ENCOUNTER — Encounter: Payer: Self-pay | Admitting: Family Medicine

## 2023-10-07 ENCOUNTER — Other Ambulatory Visit: Payer: Self-pay | Admitting: Family Medicine

## 2023-10-07 DIAGNOSIS — Z8249 Family history of ischemic heart disease and other diseases of the circulatory system: Secondary | ICD-10-CM

## 2023-10-07 DIAGNOSIS — E782 Mixed hyperlipidemia: Secondary | ICD-10-CM

## 2023-10-07 MED ORDER — ROSUVASTATIN CALCIUM 10 MG PO TABS
10.0000 mg | ORAL_TABLET | Freq: Every day | ORAL | 5 refills | Status: AC
Start: 2023-10-07 — End: ?

## 2023-12-27 ENCOUNTER — Encounter: Payer: Self-pay | Admitting: Family Medicine

## 2023-12-27 ENCOUNTER — Ambulatory Visit (INDEPENDENT_AMBULATORY_CARE_PROVIDER_SITE_OTHER): Admitting: Family Medicine

## 2023-12-27 DIAGNOSIS — Z23 Encounter for immunization: Secondary | ICD-10-CM

## 2023-12-27 NOTE — Progress Notes (Signed)
 Patient is in office today for a nurse visit for Immunization second dose of Shingrix . First dose given on 10/02/2023. Injection was given in the  Right deltoid. Patient tolerated injection well.

## 2024-10-02 ENCOUNTER — Encounter: Admitting: Family Medicine
